# Patient Record
Sex: Female | Born: 1970 | Race: Black or African American | Hispanic: No | Marital: Married | Smoking: Never smoker
Health system: Southern US, Community
[De-identification: ages and names within clinical notes are randomized; demographics above are authoritative.]

## PROBLEM LIST (undated history)

## (undated) DIAGNOSIS — I10 Essential (primary) hypertension: Secondary | ICD-10-CM

## (undated) DIAGNOSIS — R7309 Other abnormal glucose: Secondary | ICD-10-CM

## (undated) DIAGNOSIS — E0590 Thyrotoxicosis, unspecified without thyrotoxic crisis or storm: Secondary | ICD-10-CM

## (undated) DIAGNOSIS — E042 Nontoxic multinodular goiter: Secondary | ICD-10-CM

## (undated) HISTORY — DX: Essential (primary) hypertension: I10

## (undated) HISTORY — DX: Nontoxic multinodular goiter: E04.2

## (undated) HISTORY — DX: Thyrotoxicosis, unspecified without thyrotoxic crisis or storm: E05.90

## (undated) HISTORY — DX: Other abnormal glucose: R73.09

---

## 1997-07-02 ENCOUNTER — Other Ambulatory Visit: Admission: RE | Admit: 1997-07-02 | Discharge: 1997-07-02 | Payer: Self-pay | Admitting: *Deleted

## 1997-09-03 ENCOUNTER — Other Ambulatory Visit: Admission: RE | Admit: 1997-09-03 | Discharge: 1997-09-03 | Payer: Self-pay | Admitting: *Deleted

## 1997-09-28 ENCOUNTER — Inpatient Hospital Stay (HOSPITAL_COMMUNITY): Admission: AD | Admit: 1997-09-28 | Discharge: 1997-10-01 | Payer: Self-pay | Admitting: *Deleted

## 1997-10-26 ENCOUNTER — Other Ambulatory Visit: Admission: RE | Admit: 1997-10-26 | Discharge: 1997-10-26 | Payer: Self-pay | Admitting: *Deleted

## 1998-12-02 ENCOUNTER — Other Ambulatory Visit: Admission: RE | Admit: 1998-12-02 | Discharge: 1998-12-02 | Payer: Self-pay | Admitting: *Deleted

## 1999-09-13 ENCOUNTER — Emergency Department (HOSPITAL_COMMUNITY): Admission: EM | Admit: 1999-09-13 | Discharge: 1999-09-13 | Payer: Self-pay | Admitting: *Deleted

## 1999-09-13 ENCOUNTER — Encounter: Payer: Self-pay | Admitting: Emergency Medicine

## 1999-12-12 ENCOUNTER — Other Ambulatory Visit: Admission: RE | Admit: 1999-12-12 | Discharge: 1999-12-12 | Payer: Self-pay | Admitting: *Deleted

## 2000-12-13 ENCOUNTER — Other Ambulatory Visit: Admission: RE | Admit: 2000-12-13 | Discharge: 2000-12-13 | Payer: Self-pay | Admitting: *Deleted

## 2001-02-11 ENCOUNTER — Ambulatory Visit (HOSPITAL_COMMUNITY): Admission: RE | Admit: 2001-02-11 | Discharge: 2001-02-11 | Payer: Self-pay | Admitting: Endocrinology

## 2001-02-11 ENCOUNTER — Encounter: Payer: Self-pay | Admitting: Endocrinology

## 2001-12-24 ENCOUNTER — Other Ambulatory Visit: Admission: RE | Admit: 2001-12-24 | Discharge: 2001-12-24 | Payer: Self-pay | Admitting: Obstetrics and Gynecology

## 2002-06-02 ENCOUNTER — Other Ambulatory Visit: Admission: RE | Admit: 2002-06-02 | Discharge: 2002-06-02 | Payer: Self-pay | Admitting: Obstetrics and Gynecology

## 2002-09-29 ENCOUNTER — Encounter: Payer: Self-pay | Admitting: *Deleted

## 2002-09-29 ENCOUNTER — Inpatient Hospital Stay (HOSPITAL_COMMUNITY): Admission: AD | Admit: 2002-09-29 | Discharge: 2002-09-29 | Payer: Self-pay | Admitting: Obstetrics and Gynecology

## 2002-09-30 ENCOUNTER — Inpatient Hospital Stay (HOSPITAL_COMMUNITY): Admission: AD | Admit: 2002-09-30 | Discharge: 2002-09-30 | Payer: Self-pay | Admitting: Obstetrics and Gynecology

## 2002-11-27 ENCOUNTER — Inpatient Hospital Stay (HOSPITAL_COMMUNITY): Admission: AD | Admit: 2002-11-27 | Discharge: 2002-11-27 | Payer: Self-pay | Admitting: Obstetrics and Gynecology

## 2002-12-02 ENCOUNTER — Inpatient Hospital Stay (HOSPITAL_COMMUNITY): Admission: AD | Admit: 2002-12-02 | Discharge: 2002-12-06 | Payer: Self-pay | Admitting: Obstetrics and Gynecology

## 2002-12-03 ENCOUNTER — Encounter (INDEPENDENT_AMBULATORY_CARE_PROVIDER_SITE_OTHER): Payer: Self-pay | Admitting: *Deleted

## 2002-12-28 ENCOUNTER — Other Ambulatory Visit: Admission: RE | Admit: 2002-12-28 | Discharge: 2002-12-28 | Payer: Self-pay | Admitting: Obstetrics and Gynecology

## 2003-12-27 ENCOUNTER — Ambulatory Visit: Payer: Self-pay | Admitting: Internal Medicine

## 2004-03-15 ENCOUNTER — Ambulatory Visit: Payer: Self-pay | Admitting: Endocrinology

## 2004-06-16 ENCOUNTER — Ambulatory Visit: Payer: Self-pay | Admitting: Endocrinology

## 2004-09-20 ENCOUNTER — Ambulatory Visit: Payer: Self-pay | Admitting: Endocrinology

## 2005-04-04 ENCOUNTER — Ambulatory Visit: Payer: Self-pay | Admitting: Endocrinology

## 2005-04-12 ENCOUNTER — Ambulatory Visit: Payer: Self-pay | Admitting: Internal Medicine

## 2005-09-18 ENCOUNTER — Ambulatory Visit: Payer: Self-pay | Admitting: Endocrinology

## 2006-07-31 ENCOUNTER — Ambulatory Visit: Payer: Self-pay | Admitting: Endocrinology

## 2006-07-31 LAB — CONVERTED CEMR LAB
Hgb A1c MFr Bld: 5.6 % (ref 4.6–6.0)
TSH: 0.86 microintl units/mL (ref 0.35–5.50)

## 2006-10-02 ENCOUNTER — Encounter: Payer: Self-pay | Admitting: Endocrinology

## 2006-10-02 DIAGNOSIS — I10 Essential (primary) hypertension: Secondary | ICD-10-CM

## 2006-10-02 DIAGNOSIS — E0590 Thyrotoxicosis, unspecified without thyrotoxic crisis or storm: Secondary | ICD-10-CM

## 2006-10-02 HISTORY — DX: Essential (primary) hypertension: I10

## 2006-10-02 HISTORY — DX: Thyrotoxicosis, unspecified without thyrotoxic crisis or storm: E05.90

## 2007-03-05 ENCOUNTER — Ambulatory Visit (HOSPITAL_COMMUNITY): Admission: RE | Admit: 2007-03-05 | Discharge: 2007-03-05 | Payer: Self-pay | Admitting: Obstetrics and Gynecology

## 2007-09-11 ENCOUNTER — Ambulatory Visit: Payer: Self-pay | Admitting: Endocrinology

## 2007-09-11 DIAGNOSIS — E042 Nontoxic multinodular goiter: Secondary | ICD-10-CM

## 2007-09-11 DIAGNOSIS — R7309 Other abnormal glucose: Secondary | ICD-10-CM

## 2007-09-11 HISTORY — DX: Nontoxic multinodular goiter: E04.2

## 2007-09-11 HISTORY — DX: Other abnormal glucose: R73.09

## 2007-09-11 LAB — CONVERTED CEMR LAB
CO2: 29 meq/L (ref 19–32)
Chloride: 106 meq/L (ref 96–112)
GFR calc non Af Amer: 100 mL/min
Hgb A1c MFr Bld: 5.8 % (ref 4.6–6.0)
Potassium: 3.9 meq/L (ref 3.5–5.1)
Sodium: 140 meq/L (ref 135–145)
TSH: 0.49 microintl units/mL (ref 0.35–5.50)

## 2008-03-11 ENCOUNTER — Ambulatory Visit (HOSPITAL_COMMUNITY): Admission: RE | Admit: 2008-03-11 | Discharge: 2008-03-11 | Payer: Self-pay | Admitting: Obstetrics and Gynecology

## 2008-03-18 ENCOUNTER — Encounter: Admission: RE | Admit: 2008-03-18 | Discharge: 2008-03-18 | Payer: Self-pay | Admitting: Obstetrics and Gynecology

## 2008-10-08 ENCOUNTER — Telehealth (INDEPENDENT_AMBULATORY_CARE_PROVIDER_SITE_OTHER): Payer: Self-pay | Admitting: *Deleted

## 2008-10-18 ENCOUNTER — Ambulatory Visit: Payer: Self-pay | Admitting: Endocrinology

## 2009-04-15 ENCOUNTER — Ambulatory Visit (HOSPITAL_COMMUNITY): Admission: RE | Admit: 2009-04-15 | Discharge: 2009-04-15 | Payer: Self-pay | Admitting: Obstetrics and Gynecology

## 2009-04-22 ENCOUNTER — Telehealth (INDEPENDENT_AMBULATORY_CARE_PROVIDER_SITE_OTHER): Payer: Self-pay | Admitting: *Deleted

## 2010-02-26 ENCOUNTER — Encounter: Payer: Self-pay | Admitting: Obstetrics and Gynecology

## 2010-03-05 LAB — CONVERTED CEMR LAB
Calcium: 9.3 mg/dL (ref 8.4–10.5)
GFR calc non Af Amer: 102.33 mL/min (ref >60)
Hgb A1c MFr Bld: 5.5 % (ref 4.6–6.5)
Potassium: 4 meq/L (ref 3.5–5.1)
Sodium: 140 meq/L (ref 135–145)

## 2010-03-07 NOTE — Progress Notes (Signed)
Summary: Records requuest from patient  Patient request that records be sent to Dr. Horald Pollen. Request forwarded to [facility]. Dena Chavis  April 22, 2009 10:54 AM

## 2010-04-17 ENCOUNTER — Other Ambulatory Visit: Payer: Self-pay | Admitting: Obstetrics and Gynecology

## 2010-04-27 ENCOUNTER — Other Ambulatory Visit (HOSPITAL_COMMUNITY): Payer: Self-pay | Admitting: Obstetrics and Gynecology

## 2010-04-27 DIAGNOSIS — Z1231 Encounter for screening mammogram for malignant neoplasm of breast: Secondary | ICD-10-CM

## 2010-05-05 ENCOUNTER — Ambulatory Visit (HOSPITAL_COMMUNITY): Payer: Self-pay

## 2010-05-15 ENCOUNTER — Ambulatory Visit (HOSPITAL_COMMUNITY)
Admission: RE | Admit: 2010-05-15 | Discharge: 2010-05-15 | Disposition: A | Discharge status: Home / Self Care | Payer: BC Managed Care – PPO | Source: Ambulatory Visit | Attending: Obstetrics and Gynecology | Admitting: Obstetrics and Gynecology

## 2010-05-15 DIAGNOSIS — Z1231 Encounter for screening mammogram for malignant neoplasm of breast: Secondary | ICD-10-CM | POA: Insufficient documentation

## 2010-06-23 NOTE — Op Note (Signed)
NAME:  SIMREN, POPSON                       ACCOUNT NO.:  [id number]   MEDICAL RECORD NO.:  [id number]                   PATIENT TYPE:  INP   LOCATION:  9199                                 FACILITY:  [facility]   PHYSICIAN:  Maxie Better, M.D.            DATE OF BIRTH:  [birth year]   DATE OF PROCEDURE:  12/03/2002  DATE OF DISCHARGE:                                 OPERATIVE REPORT   PREOPERATIVE DIAGNOSES:  1. Oligohydramnios.  2. Intrauterine growth restriction.  3. Pregnancy-induced hypertension.  4. Hyperthyroidism.  5. Previous cesarean section.  6. Intrauterine pregnancy at 35 weeks.   PROCEDURE:  Repeat cesarean section.   POSTOPERATIVE DIAGNOSES:  1. Intrauterine growth restriction.  2. Oligohydramnios.  3. Pregnancy-induced hypertension.  4. Hyperthyroidism.  5. Previous cesarean section.  6. Intrauterine gestation at 35 weeks.   ANESTHESIA:  Spinal.   SURGEON:  Maxie Better, M.D.   ASSISTANT:  Richardean Sale, M.D.   INDICATIONS:  This is a 40 year old, gravida 3, para 2-0-0-2, married black  female with known hyperthyroidism on PTU, who is now at [redacted] weeks gestation,  admitted on December 02, 2002, secondary to oligohydramnios and intrauterine  growth restriction noted on ultrasound in the office on December 02, 2002.  The patient was also noted to have, over the last several visits, elevation  of her blood pressure and on the visit of December 02, 2002, she was also  noted to have greater than 300 mg/dL of protein in her urine dipstick.   Based on these findings, the patient was admitted to the hospital for  further evaluation and management.  PIH laboratories were performed which  were essentially normal.  The patient had had betamethasone injection during  the course of her pregnancy and in light of her gestational age and the  associated problems with the fluid and the weight of this baby, the decision  was made to proceed with delivery.  The  patient inadvertently ate late in  the afternoon.  C-section, therefore, was scheduled for appropriate time on  December 03, 2002.  Risks and benefits of the procedure have been explained  to the patient and her husband, consent has been signed.  The patient was  transferred to the operating room.   DESCRIPTION OF PROCEDURE:  Under adequate spinal anesthesia, the patient was  placed in a supine position with a left lateral tilt.  She was sterilely  prepped and draped in the usual fashion.  An indwelling Foley catheter was  sterilely placed.  Marcaine 0.25% was injected across the previous  Pfannenstiel skin incision.   A Pfannenstiel skin incision was made through the previous scar, carried  down to the rectus fascia using Bovie cautery.  The rectus fascia was  incised in the midline and extended bilaterally.  The rectus fascia was  bluntly and sharply dissected off the rectus muscle in a superior and  inferior fashion.  The rectus muscle was split  in the midline, and the  parietal peritoneum was entered bluntly and extended superiorly and  inferiorly.  A bleeder on the undersurface of the rectus fascia superiorly  was cauterized.  The vesicouterine peritoneum was then noted.  The bladder  was adherent to the lower uterine segment.   Attempt at opening the vesicouterine peritoneum was minimally successful  and, therefore, a curvilinear low transverse uterine incision was then  carefully made just above the reflection of the bladder and extended  bilaterally using bandage scissors.  The amniotic fluid was noted.  Rupture  of membranes was performed, clear fluid.  Scant fluid was noted at the time.  Subsequent delivery of a live female was accomplished who was bulb suctioned  on the abdomen.  Cord was clamped and cut.  The baby was transferred to the  awaiting pediatrician, who assigned APGARS of 9/9 at one and five minutes.  The cord around the neck x1 was easily reduced.  The placenta  was noted to  be small, shotty, and was delivered spontaneously and sent to pathology.   The uterine cavity was cleaned of debris, and no intrauterine anomalies  noted.  The uterine incision was then closed in two layers, the first layer  with a #0 Monocryl running locked stitch, second layer was imbricated using  Lembert suturing.  Small bleeders inferiorly were electrocauterized.  Normal  tubes and ovaries were noted bilaterally.  The abdomen was then copiously  irrigated and suctioned of debris.  The bleeders on the undersurface of the  rectus fascia and the muscle were all cauterized and a fair amount of time  was spent cauterizing bleeding along the parietal peritoneum, the rectus  muscles, and the undersurface of the rectus fascia superiorly and  inferiorly.  With good hemostasis subsequently noted, the rectus fascia was  then closed with #0 Vicryl x2.  There was a small defect on the right of the  midline on the fascia, and therefore a single suture of #0 Monocryl was then  placed with subsequent closure of that defect.  The subcutaneous layer was  irrigated and suctioned of debris.  The skin was then approximated using  staples.  The specimen was placenta, sent to pathology.  Estimated blood  loss was 500 mL.  Intraoperative fluid was 3 L.  Urine output was 600 mL  clear yellow urine.  Sponge and instrument counts x2 were correct.  Complications:  None.  Weight of the baby was 4 pounds 1 ounce.   The baby was taken to the central regular nursery.  The patient was  transferred to the recovery room in stable condition.                                               Maxie Better, M.D.    [place]/MEDQ  D:  12/03/2002  T:  12/03/2002  Job:  [id number]

## 2010-06-23 NOTE — Discharge Summary (Signed)
NAME:  Danielle Day, Danielle Day                       ACCOUNT NO.:  [id number]   MEDICAL RECORD NO.:  [id number]                   PATIENT TYPE:  INP   LOCATION:  9118                                 FACILITY:  [facility]   PHYSICIAN:  Maxie Better, M.D.            DATE OF BIRTH:  1970/04/03   DATE OF ADMISSION:  12/02/2002  DATE OF DISCHARGE:  12/06/2002                                 DISCHARGE SUMMARY   ADMISSION DIAGNOSES:  1. Intrauterine growth restriction.  2. Oligohydramnios.  3. Previous cesarean section.  4. Intrauterine gestation at 34-6/7 weeks.  5. Hyperthyroidism.   DISCHARGE DIAGNOSES:  1. Intrauterine gestation at 35 weeks, delivered.  2. Intrauterine growth restriction.  3. Oligohydramnios.  4. Previous cesarean section.  5. Hyperthyroidism.   HISTORY OF PRESENT ILLNESS:  A 40 year old gravida 3, para 2-0-0-2 female  previous low transverse cesarean section admitted on December 02, 2002 at 34-  6/7 weeks secondary to oligohydramnios, intrauterine growth restriction with  an ultrasound.  The patient underwent ultrasound secondary to her  hyperthyroidism which is being managed with propylthiouracil.  Ultrasound  showed estimated fetal weight of 2094 g which is the 7th percentile.  Amniotic fluid index of 6.6 which is less than the third percentile.  The  patient denied any history of leakage of fluid, no vaginal bleeding, good  fetal movement.  Her prenatal care had been complicated by premature  contractions with a positive fetal fibronectin x1.  The patient subsequently  received betamethasone and had subsequent fetal fibronectins that were  negative.  In her third trimester the patient was noted to have gestational  hypertension with normal __________ done on October 22.  The patient is  known to have a previous intrauterine growth restricted baby.  She had no  associated symptoms of preeclampsia.  The patient does have leg swelling in  the office today.  The patient  has greater than 300 mg/dl protein on urine  dipstick.  Her blood pressure in the first trimester ranged between 102-  118/80.  Second trimester blood pressure was 114-124/50-72.  Third trimester  blood pressure was originally 114/60, however, on October 22 it was 140/100  and October 25 it was 138/88.   HOSPITAL COURSE:  The patient was admitted to [facility].  She was  placed on continuous fetal monitoring.  Group B Strep culture was obtained.  Her examination was notable for fundal height of 35 cm, transverse skin  incision.  Cervix that was closed, long.  Vertex was high.  Extremities:  There was 1+ edema.  Deep tendon reflexes were 2+.  Her tracing showed a  fetal baseline heart rate of 140-145 with accelerations to 160-165,  occasional fetal heart rate decelerations spontaneously down to 110-120s.  NICU consultation was obtained.  Continuous fetal monitoring was planned.  IV hydration was started.  Routine admission laboratories and [facility]  laboratories were performed.  A 24 urine creatinine clearance and urine  total protein was started.  A stat PIH was ordered given her proteinuria.  Her admission blood pressure was 143/93.  Her laboratory studies showed  urinalysis with 100 mg/dl protein.  Uric acid 5.8.  Platelet count was  222,000.  SGOT was 15.  LDH of 149.  Given the oligohydramnios concurrent  with intrauterine growth restriction and new onset hypertension, decision  was made to proceed with a repeat cesarean section.  Due to the time of the  patient's last meal, decision was made to place her on continuous  monitoring, perform a cesarean section on December 03, 2002.  The patient  therefore underwent a repeat cesarean section on October 28 with results of  delivery of a live female weighing 4 pounds 1 ounce, cord around the neck  x1, Apgars of 9 and 9.  Scant clear amniotic fluid had been noted at time of  procedure.  Normal tubes and ovaries were identified.  Placenta was  intact,  sent to pathology and findings were notable for mature placenta with slight  chorioamnionitis.  Normal tubes and ovaries were identified at the time of  the surgery.  Cord pH was 7.27.  The patient was continued on her PTU  medication.  She had an uncomplicated postoperative course.  CBC on  postoperative day #1 showed a hemoglobin of 11.2, hematocrit of 32, platelet  count of 209,000.  By postoperative day #3 she had tolerated a regular diet.  There were no signs and symptoms of superimposed preeclampsia.  Her blood  pressures ranged between 137-144/88-94.  Her fundus was firm, below the  umbilicus.  Incision showed no erythema, induration, or exudate.  Extremities had 1+ edema.  She was deemed well to be discharged home.   DISPOSITION:  Home.   CONDITION ON DISCHARGE:  Stable.   DISCHARGE PLAN:  Return to the office on November 4 for staple removal,  repeat blood pressure check.  Follow-up appointment otherwise at the  postpartum visit in four to six weeks.  Thyroid management per Dr. Everardo All.   DISCHARGE INSTRUCTIONS:  Call for temperature greater or equal to 100.4.  Nothing per vagina for four to six weeks.  No heavy lifting.  No driving for  two weeks.  Call for severe abdominal pain, nausea, vomiting, increased  incisional pain, redness or drainage from the incision site, soaking a  regular pad every hour or more frequently.  Warning signs for preeclampsia  were reviewed.                                               Maxie Better, M.D.    [place]/MEDQ  D:  01/07/2003  T:  01/07/2003  Job:  [id number]

## 2011-05-29 ENCOUNTER — Other Ambulatory Visit (HOSPITAL_COMMUNITY): Payer: Self-pay | Admitting: Obstetrics and Gynecology

## 2011-05-29 DIAGNOSIS — Z1231 Encounter for screening mammogram for malignant neoplasm of breast: Secondary | ICD-10-CM

## 2011-06-22 ENCOUNTER — Ambulatory Visit (HOSPITAL_COMMUNITY)
Admission: RE | Admit: 2011-06-22 | Discharge: 2011-06-22 | Disposition: A | Discharge status: Home / Self Care | Payer: BC Managed Care – PPO | Source: Ambulatory Visit | Attending: Obstetrics and Gynecology | Admitting: Obstetrics and Gynecology

## 2011-06-22 DIAGNOSIS — Z1231 Encounter for screening mammogram for malignant neoplasm of breast: Secondary | ICD-10-CM | POA: Insufficient documentation

## 2012-04-16 ENCOUNTER — Other Ambulatory Visit (HOSPITAL_COMMUNITY): Payer: Self-pay | Admitting: Obstetrics and Gynecology

## 2012-04-16 DIAGNOSIS — Z803 Family history of malignant neoplasm of breast: Secondary | ICD-10-CM

## 2012-06-23 ENCOUNTER — Ambulatory Visit (HOSPITAL_COMMUNITY)
Admission: RE | Admit: 2012-06-23 | Discharge: 2012-06-23 | Disposition: A | Discharge status: Home / Self Care | Payer: BC Managed Care – PPO | Source: Ambulatory Visit | Attending: Obstetrics and Gynecology | Admitting: Obstetrics and Gynecology

## 2012-06-23 DIAGNOSIS — Z803 Family history of malignant neoplasm of breast: Secondary | ICD-10-CM

## 2012-06-23 DIAGNOSIS — Z1231 Encounter for screening mammogram for malignant neoplasm of breast: Secondary | ICD-10-CM | POA: Insufficient documentation

## 2013-04-21 ENCOUNTER — Other Ambulatory Visit (HOSPITAL_COMMUNITY): Payer: Self-pay | Admitting: Obstetrics and Gynecology

## 2013-04-21 DIAGNOSIS — Z1231 Encounter for screening mammogram for malignant neoplasm of breast: Secondary | ICD-10-CM

## 2013-06-25 ENCOUNTER — Ambulatory Visit (HOSPITAL_COMMUNITY)
Admission: RE | Admit: 2013-06-25 | Discharge: 2013-06-25 | Disposition: A | Discharge status: Home / Self Care | Payer: BC Managed Care – PPO | Source: Ambulatory Visit | Attending: Obstetrics and Gynecology | Admitting: Obstetrics and Gynecology

## 2013-06-25 DIAGNOSIS — Z1231 Encounter for screening mammogram for malignant neoplasm of breast: Secondary | ICD-10-CM | POA: Insufficient documentation

## 2013-06-26 ENCOUNTER — Other Ambulatory Visit: Payer: Self-pay | Admitting: Obstetrics and Gynecology

## 2013-06-26 DIAGNOSIS — R928 Other abnormal and inconclusive findings on diagnostic imaging of breast: Secondary | ICD-10-CM

## 2013-07-09 ENCOUNTER — Other Ambulatory Visit: Payer: BC Managed Care – PPO

## 2013-07-09 ENCOUNTER — Ambulatory Visit
Admission: RE | Admit: 2013-07-09 | Discharge: 2013-07-09 | Disposition: A | Discharge status: Home / Self Care | Payer: BC Managed Care – PPO | Source: Ambulatory Visit | Attending: Obstetrics and Gynecology | Admitting: Obstetrics and Gynecology

## 2013-07-09 DIAGNOSIS — R928 Other abnormal and inconclusive findings on diagnostic imaging of breast: Secondary | ICD-10-CM

## 2014-06-23 ENCOUNTER — Other Ambulatory Visit: Payer: Self-pay

## 2014-06-23 DIAGNOSIS — Z1231 Encounter for screening mammogram for malignant neoplasm of breast: Secondary | ICD-10-CM

## 2014-07-09 ENCOUNTER — Ambulatory Visit: Payer: Self-pay

## 2014-07-22 ENCOUNTER — Ambulatory Visit
Admission: RE | Admit: 2014-07-22 | Discharge: 2014-07-22 | Disposition: A | Discharge status: Home / Self Care | Payer: BLUE CROSS/BLUE SHIELD | Source: Ambulatory Visit

## 2014-07-22 DIAGNOSIS — Z1231 Encounter for screening mammogram for malignant neoplasm of breast: Secondary | ICD-10-CM

## 2015-06-28 ENCOUNTER — Other Ambulatory Visit: Payer: Self-pay

## 2015-06-28 DIAGNOSIS — Z1231 Encounter for screening mammogram for malignant neoplasm of breast: Secondary | ICD-10-CM

## 2015-07-01 IMAGING — MG MM DIAGNOSTIC UNILATERAL L
3 series · 3 of 3 positions shown · non-contrast
Comparison: Mammography 06/25/2013, 06/23/2012, dating back to
03/05/2007.

CLINICAL DATA: Recall from screening mammography, possible left
breast mass.

EXAM:
DIGITAL DIAGNOSTIC LEFT MAMMOGRAM
ULTRASOUND LEFT BREAST

[L MLO (1 of 2)]
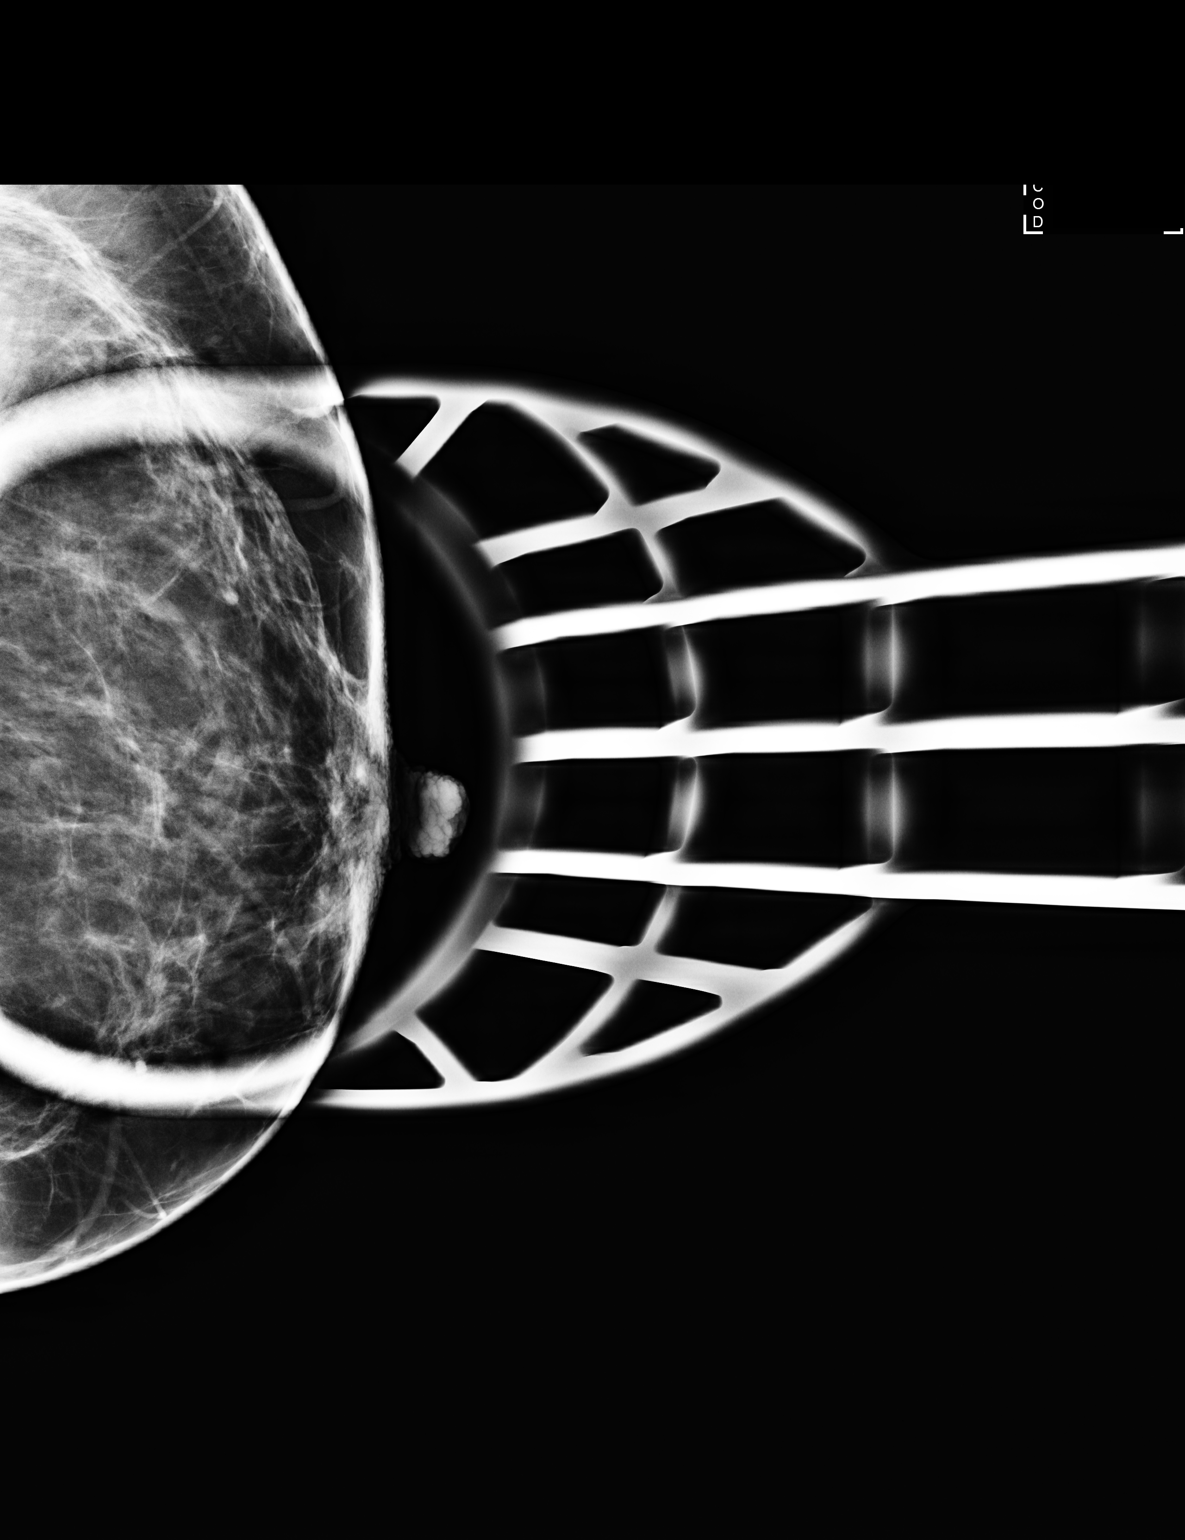

[L CC]
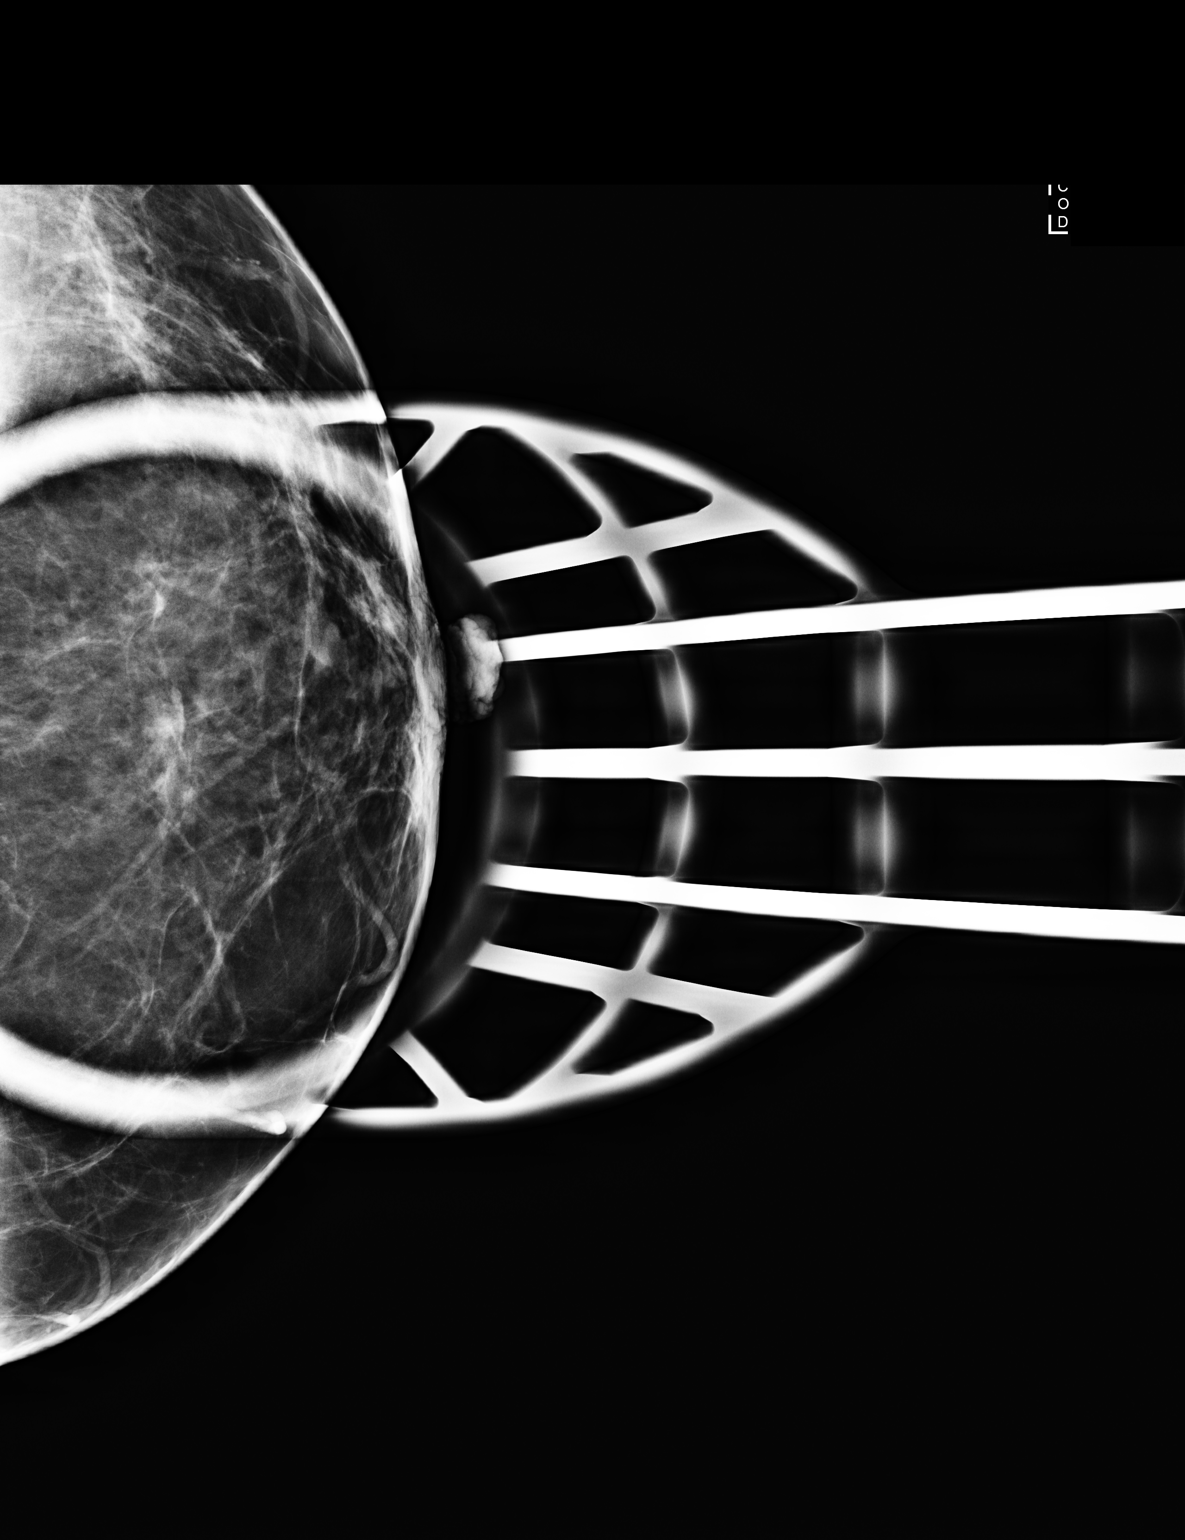

[L MLO (2 of 2)]
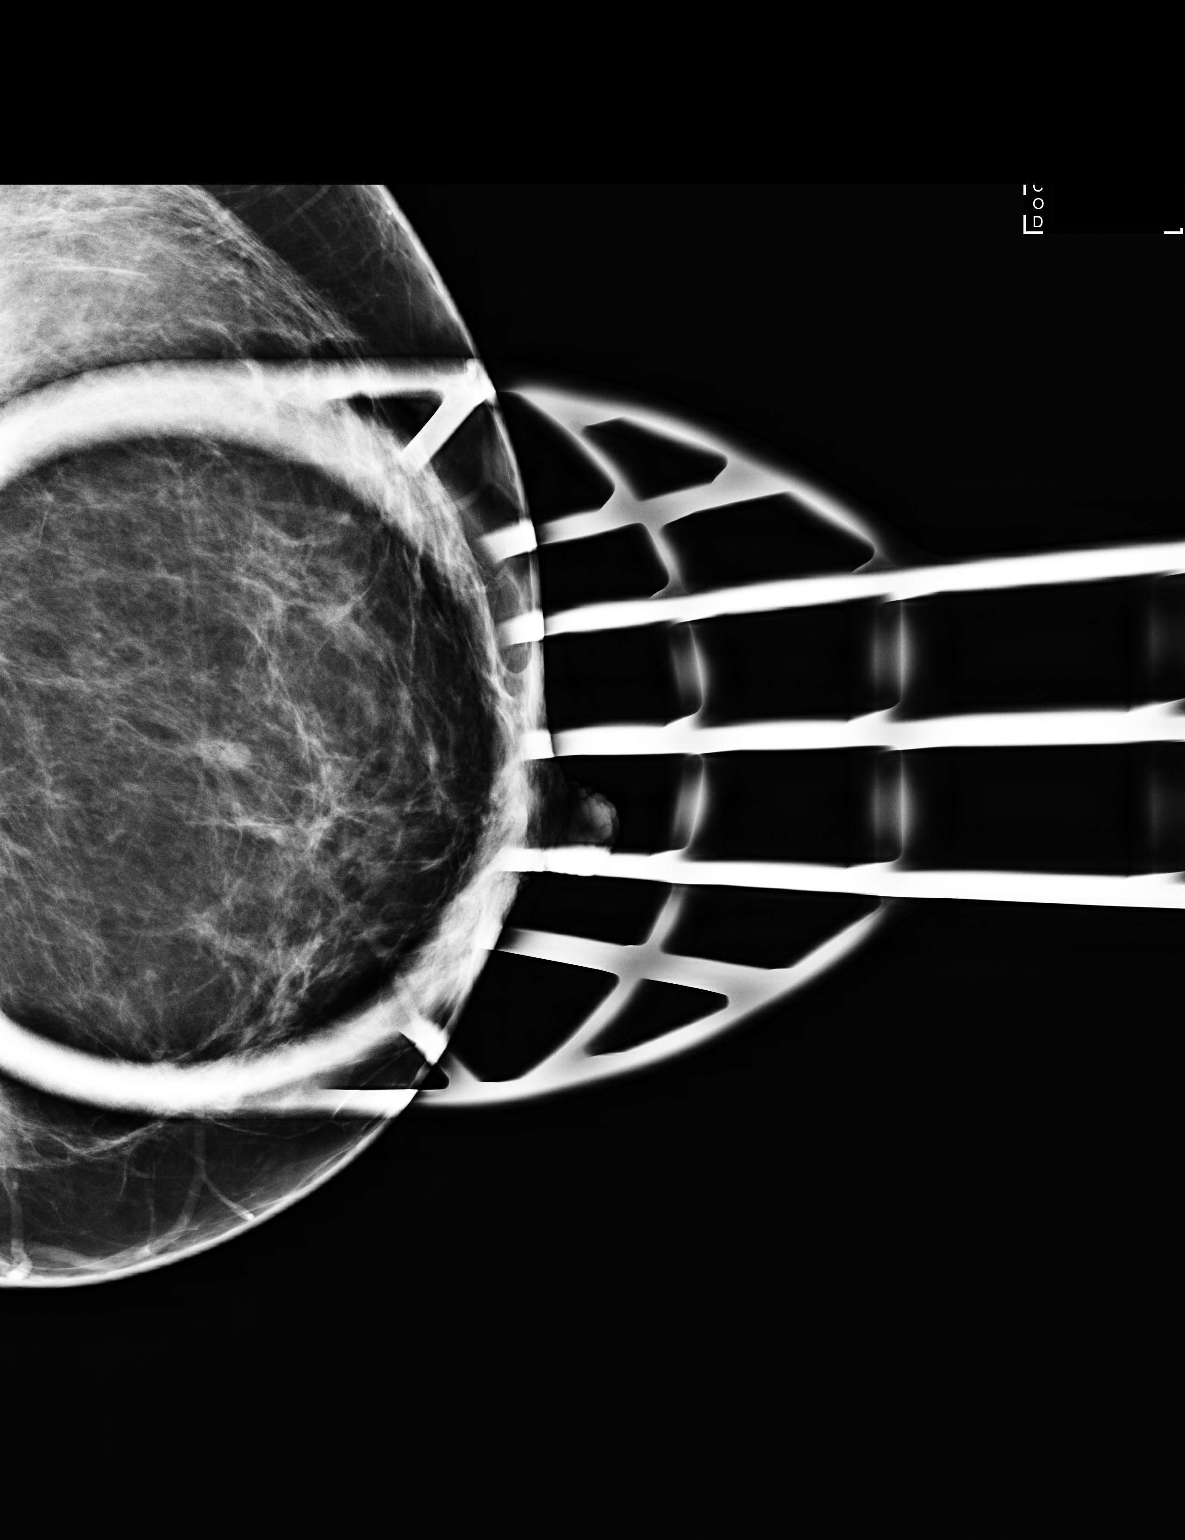

[3 of 3 positions shown; findings below may reference images not displayed]

ACR Breast Density Category b: There are scattered areas of
fibroglandular density.
FINDINGS: Spot compression CC and MLO views of the area of concern in the
subareolar left breast were obtained. No persistent mass,
architectural distortion or suspicious calcification in the area of
concern on the screening mammogram.

On physical exam, there is no palpable abnormality in the
retroareolar left breast.

Ultrasound is performed, showing normal-appearing ducts in the
subareolar left breast, accounting for the area of concern on
screening mammography. There are no intraductal masses.
IMPRESSION: No mammographic or sonographic evidence of malignancy, left breast.
Normal-appearing ducts in the subareolar left breast account for the
area of concern on screening mammography.

RECOMMENDATION:
Screening mammogram in one year.(Code:OS-1-XXU)

The importance of self breast examination and an annual clinical
breast examination was discussed with the patient. I have discussed
the findings and recommendations with the patient. Results were also
provided in writing at the conclusion of the visit. If applicable, a
reminder letter will be sent to the patient regarding the next
appointment.

BI-RADS CATEGORY  1: Negative.

## 2015-07-25 ENCOUNTER — Ambulatory Visit
Admission: RE | Admit: 2015-07-25 | Discharge: 2015-07-25 | Disposition: A | Discharge status: Home / Self Care | Payer: BC Managed Care – PPO | Source: Ambulatory Visit

## 2015-07-25 DIAGNOSIS — Z1231 Encounter for screening mammogram for malignant neoplasm of breast: Secondary | ICD-10-CM

## 2015-08-08 ENCOUNTER — Telehealth: Payer: Self-pay | Admitting: Emergency Medicine

## 2015-08-08 NOTE — Telephone Encounter (Signed)
No medications on patient list, left patient message to give us a call back

## 2015-08-08 NOTE — Telephone Encounter (Signed)
Patient called and needs refill on blood pressure med Amilorice . Has an appointment scheduled for 08/23/2015 at 9:30 am.

## 2015-08-10 NOTE — Telephone Encounter (Signed)
Pt request to speak to the assistant concern about this med. Pt stated that Dr. Everardo AllEllison gave this med to her, she will be out of this med on 08/21/15 and she has an appt with Dr. Jonny RuizJohn on 08/23/15 ( she was wondering if Dr. Jonny RuizJohn can give her some until she comes in for the appt). Please call her back

## 2015-08-10 NOTE — Telephone Encounter (Signed)
Unfortunately pt has not been seen within the [place] system/[facility] since 2010 (I think by my looking at the record)  Pt is therefore a non established pt, since has been more than 3 yrs  Please make OV to become established, as refills cannot be made when this has not occurred

## 2015-08-10 NOTE — Telephone Encounter (Signed)
Please advise can we do this 

## 2015-08-23 ENCOUNTER — Encounter: Payer: Self-pay | Admitting: Internal Medicine

## 2015-08-23 ENCOUNTER — Ambulatory Visit (INDEPENDENT_AMBULATORY_CARE_PROVIDER_SITE_OTHER): Payer: BC Managed Care – PPO | Admitting: Internal Medicine

## 2015-08-23 ENCOUNTER — Other Ambulatory Visit (INDEPENDENT_AMBULATORY_CARE_PROVIDER_SITE_OTHER): Payer: BC Managed Care – PPO

## 2015-08-23 VITALS — BP 130/70 | HR 99 | Temp 98.0°F | Resp 20 | Wt 218.0 lb

## 2015-08-23 DIAGNOSIS — R7309 Other abnormal glucose: Secondary | ICD-10-CM | POA: Diagnosis not present

## 2015-08-23 DIAGNOSIS — Z0000 Encounter for general adult medical examination without abnormal findings: Secondary | ICD-10-CM | POA: Insufficient documentation

## 2015-08-23 DIAGNOSIS — Z0001 Encounter for general adult medical examination with abnormal findings: Secondary | ICD-10-CM | POA: Diagnosis not present

## 2015-08-23 DIAGNOSIS — R6889 Other general symptoms and signs: Secondary | ICD-10-CM

## 2015-08-23 DIAGNOSIS — I10 Essential (primary) hypertension: Secondary | ICD-10-CM | POA: Diagnosis not present

## 2015-08-23 DIAGNOSIS — E0590 Thyrotoxicosis, unspecified without thyrotoxic crisis or storm: Secondary | ICD-10-CM

## 2015-08-23 LAB — URINALYSIS, ROUTINE W REFLEX MICROSCOPIC
BILIRUBIN URINE: NEGATIVE
HGB URINE DIPSTICK: NEGATIVE
Ketones, ur: NEGATIVE
LEUKOCYTES UA: NEGATIVE
NITRITE: NEGATIVE
RBC / HPF: NONE SEEN (ref 0–?)
Specific Gravity, Urine: 1.01 (ref 1.000–1.030)
TOTAL PROTEIN, URINE-UPE24: NEGATIVE
URINE GLUCOSE: NEGATIVE
Urobilinogen, UA: 0.2 (ref 0.0–1.0)
WBC UA: NONE SEEN (ref 0–?)
pH: 6.5 (ref 5.0–8.0)

## 2015-08-23 LAB — CBC WITH DIFFERENTIAL/PLATELET
BASOS PCT: 0.5 % (ref 0.0–3.0)
Basophils Absolute: 0 10*3/uL (ref 0.0–0.1)
EOS PCT: 4.8 % (ref 0.0–5.0)
Eosinophils Absolute: 0.2 10*3/uL (ref 0.0–0.7)
HEMATOCRIT: 41.8 % (ref 36.0–46.0)
HEMOGLOBIN: 14.5 g/dL (ref 12.0–15.0)
LYMPHS PCT: 28.3 % (ref 12.0–46.0)
Lymphs Abs: 1.4 10*3/uL (ref 0.7–4.0)
MCHC: 34.7 g/dL (ref 30.0–36.0)
MCV: 90.8 fl (ref 78.0–100.0)
MONO ABS: 0.3 10*3/uL (ref 0.1–1.0)
MONOS PCT: 6.8 % (ref 3.0–12.0)
Neutro Abs: 2.9 10*3/uL (ref 1.4–7.7)
Neutrophils Relative %: 59.6 % (ref 43.0–77.0)
Platelets: 195 10*3/uL (ref 150.0–400.0)
RBC: 4.61 Mil/uL (ref 3.87–5.11)
RDW: 13.6 % (ref 11.5–15.5)
WBC: 4.8 10*3/uL (ref 4.0–10.5)

## 2015-08-23 LAB — TSH: TSH: 1.02 u[IU]/mL (ref 0.35–4.50)

## 2015-08-23 LAB — BASIC METABOLIC PANEL
BUN: 11 mg/dL (ref 6–23)
CALCIUM: 10 mg/dL (ref 8.4–10.5)
CO2: 29 mEq/L (ref 19–32)
CREATININE: 0.68 mg/dL (ref 0.40–1.20)
Chloride: 104 mEq/L (ref 96–112)
GFR: 119.52 mL/min (ref >60.00)
GLUCOSE: 97 mg/dL (ref 70–99)
Potassium: 4.2 mEq/L (ref 3.5–5.1)
Sodium: 139 mEq/L (ref 135–145)

## 2015-08-23 LAB — HEMOGLOBIN A1C: HEMOGLOBIN A1C: 6 % (ref 4.6–6.5)

## 2015-08-23 LAB — HEPATIC FUNCTION PANEL
ALBUMIN: 4.3 g/dL (ref 3.5–5.2)
ALT: 15 U/L (ref 0–35)
AST: 19 U/L (ref 0–37)
Alkaline Phosphatase: 59 U/L (ref 39–117)
BILIRUBIN TOTAL: 0.5 mg/dL (ref 0.2–1.2)
Bilirubin, Direct: 0.1 mg/dL (ref 0.0–0.3)
Total Protein: 8.1 g/dL (ref 6.0–8.3)

## 2015-08-23 LAB — LIPID PANEL
Cholesterol: 216 mg/dL — ABNORMAL HIGH (ref 0–200)
HDL: 60 mg/dL (ref >39.00)
LDL Cholesterol: 143 mg/dL — ABNORMAL HIGH (ref 0–99)
NONHDL: 156.35
TRIGLYCERIDES: 67 mg/dL (ref 0.0–149.0)
Total CHOL/HDL Ratio: 4
VLDL: 13.4 mg/dL (ref 0.0–40.0)

## 2015-08-23 MED ORDER — AMILORIDE HCL 5 MG PO TABS: 5.0000 mg | ORAL_TABLET | Freq: Every day | ORAL | Status: DC

## 2015-08-23 NOTE — Patient Instructions (Signed)

## 2015-08-23 NOTE — Assessment & Plan Note (Signed)
S/p iodine and off all meds

## 2015-08-23 NOTE — Progress Notes (Signed)
Subjective:    Patient ID: Danielle Day, female    DOB: 05/27/1970, 45 y.o.   MRN: [id number]  HPI  Here for wellness and f/u;  Overall doing ok;  Pt denies Chest pain, worsening SOB, DOE, wheezing, orthopnea, PND, worsening LE edema, palpitations, dizziness or syncope.  Pt denies neurological change such as new headache, facial or extremity weakness.  Pt denies polydipsia, polyuria, or low sugar symptoms. Pt states overall good compliance with treatment and medications, good tolerability, and has been trying to follow appropriate diet.  Pt denies worsening depressive symptoms, suicidal ideation or panic. No fever, night sweats, wt loss, loss of appetite, or other constitutional symptoms.  Pt states good ability with ADL's, has low fall risk, home safety reviewed and adequate, no other significant changes in hearing or vision, and only occasionally active with exercise. Sees Dr Cousins/GYN with aug 2016 with mild hyperglycemia/high normal a1c Past Medical History:  Diagnosis Date  . Essential hypertension 10/02/2006   Qualifier: Diagnosis of  By: Everardo AllEllison MD, Cleophas DunkerSean A   . GOITER, MULTINODULAR 09/11/2007   Qualifier: Diagnosis of  By: Everardo AllEllison MD, Cleophas DunkerSean A   . HYPERGLYCEMIA 09/11/2007   Qualifier: Diagnosis of  By: Everardo AllEllison MD, Cleophas DunkerSean A   . Hyperthyroidism 10/02/2006   Qualifier: Diagnosis of  By: Everardo AllEllison MD, Sean A    No past surgical history on file.  reports that she has never smoked. She does not have any smokeless tobacco history on file. Her alcohol and drug histories are not on file. family history is not on file. Not on File No current outpatient prescriptions on file prior to visit.   No current facility-administered medications on file prior to visit.   '  Review of Systems Constitutional: Negative for increased diaphoresis, or other activity, appetite or siginficant weight change other than noted HENT: Negative for worsening hearing loss, ear pain, facial swelling, mouth sores and neck  stiffness.   Eyes: Negative for other worsening pain, redness or visual disturbance.  Respiratory: Negative for choking or stridor Cardiovascular: Negative for other chest pain and palpitations.  Gastrointestinal: Negative for worsening diarrhea, blood in stool, or abdominal distention Genitourinary: Negative for hematuria, flank pain or change in urine volume.  Musculoskeletal: Negative for myalgias or other joint complaints.  Skin: Negative for other color change and wound or drainage.  Neurological: Negative for syncope and numbness. other than noted Hematological: Negative for adenopathy. or other swelling Psychiatric/Behavioral: Negative for hallucinations, SI, self-injury, decreased concentration or other worsening agitation.      Objective:   Physical Exam BP 130/70   Pulse 99   Temp 98 F (36.7 C) (Oral)   Resp 20   Wt 218 lb (98.9 kg)   SpO2 98%   BMI 37.42 kg/m   VS noted,  Constitutional: Pt is oriented to person, place, and time. Appears well-developed and well-nourished, in no significant distress Head: Normocephalic and atraumatic  Eyes: Conjunctivae and EOM are normal. Pupils are equal, round, and reactive to light Right Ear: External ear normal.  Left Ear: External ear normal Nose: Nose normal.  Mouth/Throat: Oropharynx is clear and moist  Neck: Normal range of motion. Neck supple. No JVD present. No tracheal deviation present or significant neck LA or mass Cardiovascular: Normal rate, regular rhythm, normal heart sounds and intact distal pulses.   Pulmonary/Chest: Effort normal and breath sounds without rales or wheezing  Abdominal: Soft. Bowel sounds are normal. NT. No HSM  Musculoskeletal: Normal range of motion. Exhibits no edema  Lymphadenopathy: Has no cervical adenopathy.  Neurological: Pt is alert and oriented to person, place, and time. Pt has normal reflexes. No cranial nerve deficit. Motor grossly intact Skin: Skin is warm and dry. No rash noted or new  ulcers Psychiatric:  Has normal mood and affect. Behavior is normal.     Assessment & Plan:

## 2015-08-23 NOTE — Progress Notes (Signed)
Pre visit review using our clinic review tool, if applicable. No additional management support is needed unless otherwise documented below in the visit note. 

## 2015-08-26 ENCOUNTER — Telehealth: Payer: Self-pay | Admitting: *Deleted

## 2015-08-26 MED ORDER — AMILORIDE-HYDROCHLOROTHIAZIDE 5-50 MG PO TABS: ORAL_TABLET | ORAL | Status: DC

## 2015-08-26 NOTE — Telephone Encounter (Signed)
Pharmacy left msg on triage stating calling to clarify which medication pt suppose to be taking. We received script for Amiloride, but pt states she suppose to be taking the combination w/HCTZ. Pls advise on which pt should be taking...Raechel Chute/lmb

## 2015-08-26 NOTE — Telephone Encounter (Signed)
Ok, this has been corrected, thanks

## 2015-08-28 ENCOUNTER — Encounter: Payer: Self-pay | Admitting: Internal Medicine

## 2015-08-28 NOTE — Assessment & Plan Note (Signed)

## 2015-08-28 NOTE — Assessment & Plan Note (Signed)
Mild, for a1c,  to f/u any worsening symptoms or concerns 

## 2015-08-28 NOTE — Assessment & Plan Note (Signed)
For med restart,  to f/u any worsening symptoms or concerns  

## 2015-12-06 ENCOUNTER — Encounter: Payer: Self-pay | Admitting: Nurse Practitioner

## 2015-12-06 ENCOUNTER — Ambulatory Visit (INDEPENDENT_AMBULATORY_CARE_PROVIDER_SITE_OTHER): Payer: BC Managed Care – PPO | Admitting: Nurse Practitioner

## 2015-12-06 ENCOUNTER — Ambulatory Visit: Payer: BC Managed Care – PPO | Admitting: Nurse Practitioner

## 2015-12-06 VITALS — BP 116/68 | HR 115 | Temp 98.7°F | Wt 213.0 lb

## 2015-12-06 DIAGNOSIS — J0140 Acute pansinusitis, unspecified: Secondary | ICD-10-CM | POA: Diagnosis not present

## 2015-12-06 MED ORDER — FLUTICASONE PROPIONATE 50 MCG/ACT NA SUSP: 2.0000 | Freq: Every day | NASAL | 0 refills | Status: DC

## 2015-12-06 MED ORDER — GUAIFENESIN ER 600 MG PO TB12: 600.0000 mg | ORAL_TABLET | Freq: Two times a day (BID) | ORAL | 0 refills | Status: DC | PRN

## 2015-12-06 MED ORDER — AMOXICILLIN-POT CLAVULANATE 875-125 MG PO TABS: 1.0000 | ORAL_TABLET | Freq: Two times a day (BID) | ORAL | 0 refills | Status: DC

## 2015-12-06 MED ORDER — OXYMETAZOLINE HCL 0.05 % NA SOLN: 1.0000 | Freq: Two times a day (BID) | NASAL | 0 refills | Status: DC

## 2015-12-06 NOTE — Progress Notes (Signed)
Subjective:  Patient ID: Danielle CatalanShannon M Ecklund, female    DOB: 09/01/1970  Age: 45 y.o. MRN: [id number]  CC: Sinus Problem (Pt stated having sinus pressure, ear pain, coughing for 2 weeks)   Sinus Problem  This is a new problem. The current episode started 1 to 4 weeks ago. The problem has been gradually worsening since onset. There has been no fever. Associated symptoms include congestion, coughing, ear pain, headaches, sinus pressure, sneezing and a sore throat. Pertinent negatives include no chills, diaphoresis, hoarse voice, shortness of breath or swollen glands. Past treatments include acetaminophen and oral decongestants. The treatment provided no relief.    Outpatient Medications Prior to Visit  Medication Sig Dispense Refill  . amiloride-hydrochlorothiazide (MODURETIC) 5-50 MG tablet 1/2 tab by mouth daily 90 tablet 3   No facility-administered medications prior to visit.     ROS See HPI  Objective:  BP 116/68 (BP Location: Left Arm, Patient Position: Sitting, Cuff Size: Large)   Pulse (!) 115   Temp 98.7 F (37.1 C)   Wt 213 lb (96.6 kg)   SpO2 98%   BMI 36.56 kg/m   BP Readings from Last 3 Encounters:  12/06/15 116/68  08/23/15 130/70  10/18/08 116/64    Wt Readings from Last 3 Encounters:  12/06/15 213 lb (96.6 kg)  08/23/15 218 lb (98.9 kg)  10/18/08 189 lb (85.7 kg)    Physical Exam  HENT:  Right Ear: Tympanic membrane, external ear and ear canal normal.  Left Ear: Tympanic membrane, external ear and ear canal normal.  Nose: Mucosal edema and rhinorrhea present.  Mouth/Throat: Uvula is midline. Posterior oropharyngeal erythema present. No oropharyngeal exudate.  Eyes: No scleral icterus.  Neck: Normal range of motion. Neck supple.  Lymphadenopathy:    She has no cervical adenopathy.  Vitals reviewed.   Lab Results  Component Value Date   WBC 4.8 08/23/2015   HGB 14.5 08/23/2015   HCT 41.8 08/23/2015   PLT 195.0 08/23/2015   GLUCOSE 97 08/23/2015     CHOL 216 (H) 08/23/2015   TRIG 67.0 08/23/2015   HDL 60.00 08/23/2015   LDLCALC 143 (H) 08/23/2015   ALT 15 08/23/2015   AST 19 08/23/2015   NA 139 08/23/2015   K 4.2 08/23/2015   CL 104 08/23/2015   CREATININE 0.68 08/23/2015   BUN 11 08/23/2015   CO2 29 08/23/2015   TSH 1.02 08/23/2015   HGBA1C 6.0 08/23/2015    Mm Digital Screening Bilateral  Result Date: 07/27/2015 CLINICAL DATA:  Screening. EXAM: DIGITAL SCREENING BILATERAL MAMMOGRAM WITH CAD COMPARISON:  Previous exam(s). ACR Breast Density Category b: There are scattered areas of fibroglandular density. FINDINGS: There are no findings suspicious for malignancy. Images were processed with CAD. IMPRESSION: No mammographic evidence of malignancy. A result letter of this screening mammogram will be mailed directly to the patient. RECOMMENDATION: Screening mammogram in one year. (Code:SM-B-01Y) BI-RADS CATEGORY  1: Negative. Electronically Signed   By: Baird Lyonsina  Arceo M.D.   On: 07/27/2015 13:56    Assessment & Plan:   Carollee HerterShannon was seen today for sinus problem.  Diagnoses and all orders for this visit:  Acute non-recurrent pansinusitis -     amoxicillin-clavulanate (AUGMENTIN) 875-125 MG tablet; Take 1 tablet by mouth 2 (two) times daily. -     guaiFENesin (MUCINEX) 600 MG 12 hr tablet; Take 1 tablet (600 mg total) by mouth 2 (two) times daily as needed for cough or to loosen phlegm. -     fluticasone (  FLONASE) 50 MCG/ACT nasal spray; Place 2 sprays into both nostrils daily. -     oxymetazoline (AFRIN NASAL SPRAY) 0.05 % nasal spray; Place 1 spray into both nostrils 2 (two) times daily. Use only for 3days, then stop   I am having Ms. Parillo start on amoxicillin-clavulanate, guaiFENesin, fluticasone, and oxymetazoline. I am also having her maintain her amiloride-hydrochlorothiazide, loratadine, and multivitamin.  Meds ordered this encounter  Medications  . loratadine (CLARITIN) 10 MG tablet    Sig: Take 10 mg by mouth daily.   . Multiple Vitamin (MULTIVITAMIN) tablet    Sig: Take 1 tablet by mouth daily.  Marland Kitchen. amoxicillin-clavulanate (AUGMENTIN) 875-125 MG tablet    Sig: Take 1 tablet by mouth 2 (two) times daily.    Dispense:  14 tablet    Refill:  0    Order Specific Question:   Supervising Provider    Answer:   Tresa GarterPLOTNIKOV, ALEKSEI V [[id number]]  . guaiFENesin (MUCINEX) 600 MG 12 hr tablet    Sig: Take 1 tablet (600 mg total) by mouth 2 (two) times daily as needed for cough or to loosen phlegm.    Dispense:  14 tablet    Refill:  0    Order Specific Question:   Supervising Provider    Answer:   Tresa GarterPLOTNIKOV, ALEKSEI V [[id number]]  . fluticasone (FLONASE) 50 MCG/ACT nasal spray    Sig: Place 2 sprays into both nostrils daily.    Dispense:  16 g    Refill:  0    Order Specific Question:   Supervising Provider    Answer:   Tresa GarterPLOTNIKOV, ALEKSEI V [[id number]]  . oxymetazoline (AFRIN NASAL SPRAY) 0.05 % nasal spray    Sig: Place 1 spray into both nostrils 2 (two) times daily. Use only for 3days, then stop    Dispense:  30 mL    Refill:  0    Order Specific Question:   Supervising Provider    Answer:   Tresa GarterPLOTNIKOV, ALEKSEI V [[id number]]    Follow-up: Return if symptoms worsen or fail to improve.  Alysia Pennaharlotte Tahsin Benyo, NP

## 2015-12-06 NOTE — Patient Instructions (Signed)
URI Instructions: Flonase and Afrin use: apply 1spray of afrin in each nare, wait 5mins, then apply 2sprays of flonase in each nare. Use both nasal spray consecutively x 3days, then flonase only for at least 14days.  Use over-the-counter  "cold" medicines  such as "Tylenol cold" , "Advil cold",  "Mucinex" or" Mucinex D"  for cough and congestion.  Avoid decongestants if you have high blood pressure. Use" Delsym" or" Robitussin" cough syrup varietis for cough.  You can use plain "Tylenol" or "Advi"l for fever, chills and achyness.   "Common cold" symptoms are usually triggered by a virus.  The antibiotics are usually not necessary. On average, a" viral cold" illness would take 4-7 days to resolve. Please, make an appointment if you are not better or if you're worse.  

## 2015-12-06 NOTE — Progress Notes (Signed)
Pre visit review using our clinic review tool, if applicable. No additional management support is needed unless otherwise documented below in the visit note. 

## 2016-01-05 ENCOUNTER — Telehealth: Payer: Self-pay | Admitting: Nurse Practitioner

## 2016-01-05 DIAGNOSIS — J0140 Acute pansinusitis, unspecified: Secondary | ICD-10-CM

## 2016-01-05 NOTE — Telephone Encounter (Signed)
Pt request refill for Fluticason 50 mcg nas spray from [facility]. Pt was seen by you on 12/06/2015. We sent 1 rx for this med in October 2017. Please advise

## 2016-01-05 NOTE — Telephone Encounter (Signed)
Ok to send

## 2016-01-06 MED ORDER — FLUTICASONE PROPIONATE 50 MCG/ACT NA SUSP: 2.0000 | Freq: Every day | NASAL | 0 refills | Status: DC

## 2016-01-06 NOTE — Telephone Encounter (Signed)
Rx for fluticasone sent to [facility].

## 2016-02-17 ENCOUNTER — Telehealth: Payer: Self-pay

## 2016-02-17 DIAGNOSIS — J0140 Acute pansinusitis, unspecified: Secondary | ICD-10-CM

## 2016-02-17 MED ORDER — FLUTICASONE PROPIONATE 50 MCG/ACT NA SUSP: 2.0000 | Freq: Every day | NASAL | 0 refills | Status: DC

## 2016-02-17 NOTE — Telephone Encounter (Signed)
Pt called and is requesting a refill of flonase. Please advise.

## 2016-02-17 NOTE — Telephone Encounter (Signed)
LVM for pt to call back as soon as possible.   RE: [facility] message for pt [facility]

## 2016-02-17 NOTE — Telephone Encounter (Signed)
Ok for routine refill, thanks 

## 2016-03-16 ENCOUNTER — Other Ambulatory Visit: Payer: Self-pay | Admitting: Internal Medicine

## 2016-03-16 DIAGNOSIS — J0140 Acute pansinusitis, unspecified: Secondary | ICD-10-CM

## 2016-05-03 ENCOUNTER — Other Ambulatory Visit: Payer: Self-pay | Admitting: Internal Medicine

## 2016-05-03 DIAGNOSIS — J0140 Acute pansinusitis, unspecified: Secondary | ICD-10-CM

## 2016-05-19 ENCOUNTER — Encounter (INDEPENDENT_AMBULATORY_CARE_PROVIDER_SITE_OTHER): Payer: Self-pay

## 2016-05-19 ENCOUNTER — Ambulatory Visit (INDEPENDENT_AMBULATORY_CARE_PROVIDER_SITE_OTHER): Payer: BC Managed Care – PPO | Admitting: Family Medicine

## 2016-05-19 ENCOUNTER — Encounter: Payer: Self-pay | Admitting: Family Medicine

## 2016-05-19 VITALS — BP 104/66 | HR 102 | Temp 97.9°F | Wt 213.1 lb

## 2016-05-19 DIAGNOSIS — J0100 Acute maxillary sinusitis, unspecified: Secondary | ICD-10-CM | POA: Diagnosis not present

## 2016-05-19 MED ORDER — AMOXICILLIN 875 MG PO TABS: 875.0000 mg | ORAL_TABLET | Freq: Two times a day (BID) | ORAL | 0 refills | Status: DC

## 2016-05-19 NOTE — Progress Notes (Signed)
Pre visit review using our clinic review tool, if applicable. No additional management support is needed unless otherwise documented below in the visit note. 

## 2016-05-19 NOTE — Patient Instructions (Signed)
Follow up as needed Start the Amoxicillin twice daily- take w/ food Drink plenty of fluids Continue the Zyrtec daily REST! Call with any questions or concerns Hang in there!!! 

## 2016-05-19 NOTE — Progress Notes (Signed)
   Subjective:    Patient ID: Danielle Day, female    DOB: 05-18-1970, 46 y.o.   MRN: [id number]  HPI URI- pt reports she struggles w/ seasonal allergies and has frequent sinus infxns.  Yesterday developed severe head pressure, dizziness, ear popping w/ swallowing, HA.  Upper tooth pain on R.  + nausea.   Denies cough.    Review of Systems For ROS see HPI     Objective:   Physical Exam  Constitutional: She is oriented to person, place, and time. She appears well-developed and well-nourished. No distress.  HENT:  Head: Normocephalic and atraumatic.  Right Ear: Tympanic membrane normal.  Left Ear: Tympanic membrane normal.  Nose: Mucosal edema and rhinorrhea present. Right sinus exhibits maxillary sinus tenderness and frontal sinus tenderness. Left sinus exhibits maxillary sinus tenderness and frontal sinus tenderness.  Mouth/Throat: Uvula is midline and mucous membranes are normal. Posterior oropharyngeal erythema present. No oropharyngeal exudate.  Eyes: Conjunctivae and EOM are normal. Pupils are equal, round, and reactive to light.  Neck: Normal range of motion. Neck supple.  Cardiovascular: Normal rate, regular rhythm and normal heart sounds.   Pulmonary/Chest: Effort normal and breath sounds normal. No respiratory distress. She has no wheezes.  Lymphadenopathy:    She has no cervical adenopathy.  Neurological: She is alert and oriented to person, place, and time.  Skin: Skin is warm and dry.  Psychiatric: She has a normal mood and affect. Her behavior is normal. Thought content normal.  Vitals reviewed.         Assessment & Plan:  Maxillary sinusitis- new.  Pt's sxs and PE consistent w/ infxn.  Start abx.  Reviewed supportive care and red flags that should prompt return.  Pt expressed understanding and is in agreement w/ plan.

## 2016-06-04 ENCOUNTER — Other Ambulatory Visit: Payer: Self-pay | Admitting: Nurse Practitioner

## 2016-06-04 DIAGNOSIS — J0140 Acute pansinusitis, unspecified: Secondary | ICD-10-CM

## 2016-07-26 ENCOUNTER — Other Ambulatory Visit: Payer: Self-pay | Admitting: Obstetrics and Gynecology

## 2016-07-26 DIAGNOSIS — Z1231 Encounter for screening mammogram for malignant neoplasm of breast: Secondary | ICD-10-CM

## 2016-08-09 ENCOUNTER — Ambulatory Visit
Admission: RE | Admit: 2016-08-09 | Discharge: 2016-08-09 | Disposition: A | Discharge status: Home / Self Care | Payer: BC Managed Care – PPO | Source: Ambulatory Visit | Attending: Obstetrics and Gynecology | Admitting: Obstetrics and Gynecology

## 2016-08-09 ENCOUNTER — Encounter: Payer: Self-pay | Admitting: Radiology

## 2016-08-09 DIAGNOSIS — Z1231 Encounter for screening mammogram for malignant neoplasm of breast: Secondary | ICD-10-CM

## 2016-11-02 ENCOUNTER — Other Ambulatory Visit: Payer: Self-pay | Admitting: Internal Medicine

## 2016-11-10 ENCOUNTER — Other Ambulatory Visit: Payer: Self-pay | Admitting: Internal Medicine

## 2016-11-15 ENCOUNTER — Ambulatory Visit (INDEPENDENT_AMBULATORY_CARE_PROVIDER_SITE_OTHER): Payer: BC Managed Care – PPO | Admitting: Internal Medicine

## 2016-11-15 ENCOUNTER — Encounter: Payer: Self-pay | Admitting: Internal Medicine

## 2016-11-15 VITALS — BP 120/80 | HR 117 | Temp 98.2°F | Ht 64.0 in | Wt 215.0 lb

## 2016-11-15 DIAGNOSIS — Z23 Encounter for immunization: Secondary | ICD-10-CM | POA: Diagnosis not present

## 2016-11-15 DIAGNOSIS — J309 Allergic rhinitis, unspecified: Secondary | ICD-10-CM

## 2016-11-15 DIAGNOSIS — Z0000 Encounter for general adult medical examination without abnormal findings: Secondary | ICD-10-CM | POA: Diagnosis not present

## 2016-11-15 DIAGNOSIS — J0140 Acute pansinusitis, unspecified: Secondary | ICD-10-CM | POA: Diagnosis not present

## 2016-11-15 MED ORDER — AMILORIDE-HYDROCHLOROTHIAZIDE 5-50 MG PO TABS: ORAL_TABLET | ORAL | 3 refills | Status: DC

## 2016-11-15 MED ORDER — FLUTICASONE PROPIONATE 50 MCG/ACT NA SUSP: NASAL | 5 refills | Status: DC

## 2016-11-15 MED ORDER — PREDNISONE 10 MG PO TABS: ORAL_TABLET | ORAL | 0 refills | Status: DC

## 2016-11-15 NOTE — Assessment & Plan Note (Signed)

## 2016-11-15 NOTE — Progress Notes (Signed)
Subjective:    Patient ID: Danielle Day, female    DOB: 1970-06-17, 46 y.o.   MRN: [id number]  HPI  Here for wellness and f/u;  Overall doing ok;  Pt denies Chest pain, worsening SOB, DOE, wheezing, orthopnea, PND, worsening LE edema, palpitations, dizziness or syncope.  Pt denies neurological change such as new headache, facial or extremity weakness.  Pt denies polydipsia, polyuria, or low sugar symptoms. Pt states overall good compliance with treatment and medications, good tolerability, and has been trying to follow appropriate diet.  Pt denies worsening depressive symptoms, suicidal ideation or panic. No fever, night sweats, wt loss, loss of appetite, or other constitutional symptoms.  Pt states good ability with ADL's, has low fall risk, home safety reviewed and adequate, no other significant changes in hearing or vision, and only occasionally active with exercise. Plans to try do more soon. Had labs with GYN last month, also referred for sleep apnea evaluation. .Does have several wks ongoing nasal allergy symptoms with clearish congestion, itch and sneezing, without fever, pain, ST, cough, swelling or wheezing. Past Medical History:  Diagnosis Date  . Essential hypertension 10/02/2006   Qualifier: Diagnosis of  By: Everardo All MD, Cleophas Dunker   . GOITER, MULTINODULAR 09/11/2007   Qualifier: Diagnosis of  By: Everardo All MD, Cleophas Dunker   . HYPERGLYCEMIA 09/11/2007   Qualifier: Diagnosis of  By: Everardo All MD, Cleophas Dunker   . Hyperthyroidism 10/02/2006   Qualifier: Diagnosis of  By: Everardo All MD, Sean A    No past surgical history on file.  reports that she has never smoked. She has never used smokeless tobacco. Her alcohol and drug histories are not on file. family history includes Breast cancer in her maternal grandmother. Not on File Current Outpatient Prescriptions on File Prior to Visit  Medication Sig Dispense Refill  . guaiFENesin (MUCINEX) 600 MG 12 hr tablet Take 1 tablet (600 mg total) by mouth 2 (two)  times daily as needed for cough or to loosen phlegm. 14 tablet 0  . Multiple Vitamin (MULTIVITAMIN) tablet Take 1 tablet by mouth daily.    Marland Kitchen oxymetazoline (AFRIN NASAL SPRAY) 0.05 % nasal spray Place 1 spray into both nostrils 2 (two) times daily. Use only for 3days, then stop 30 mL 0   No current facility-administered medications on file prior to visit.    Review of Systems Constitutional: Negative for other unusual diaphoresis, sweats, appetite or weight changes HENT: Negative for other worsening hearing loss, ear pain, facial swelling, mouth sores or neck stiffness.   Eyes: Negative for other worsening pain, redness or other visual disturbance.  Respiratory: Negative for other stridor or swelling Cardiovascular: Negative for other palpitations or other chest pain  Gastrointestinal: Negative for worsening diarrhea or loose stools, blood in stool, distention or other pain Genitourinary: Negative for hematuria, flank pain or other change in urine volume.  Musculoskeletal: Negative for myalgias or other joint swelling.  Skin: Negative for other color change, or other wound or worsening drainage.  Neurological: Negative for other syncope or numbness. Hematological: Negative for other adenopathy or swelling Psychiatric/Behavioral: Negative for hallucinations, other worsening agitation, SI, self-injury, or new decreased concentration All other system neg per pt    Objective:   Physical Exam BP 120/80   Pulse (!) 117   Temp 98.2 F (36.8 C) (Oral)   Ht  (1.626 m)   Wt 215 lb (97.5 kg)   LMP 06/22/2011   SpO2 98%   BMI 36.90 kg/m  VS noted,  Constitutional: Pt is oriented to person, place, and time. Appears well-developed and well-nourished, in no significant distress and comfortable Head: Normocephalic and atraumatic  Eyes: Conjunctivae and EOM are normal. Pupils are equal, round, and reactive to light Bilat tm's with mild erythema.  Max sinus areas non tender.  Pharynx with  mild erythema, no exudate Right Ear: External ear normal without discharge Left Ear: External ear normal without discharge Nose: Nose without discharge or deformity Mouth/Throat: Oropharynx is without other ulcerations and moist  Neck: Normal range of motion. Neck supple. No JVD present. No tracheal deviation present or significant neck LA or mass Cardiovascular: Normal rate, regular rhythm, normal heart sounds and intact distal pulses.   Pulmonary/Chest: WOB normal and breath sounds without rales or wheezing  Abdominal: Soft. Bowel sounds are normal. NT. No HSM  Musculoskeletal: Normal range of motion. Exhibits no edema Lymphadenopathy: Has no other cervical adenopathy.  Neurological: Pt is alert and oriented to person, place, and time. Pt has normal reflexes. No cranial nerve deficit. Motor grossly intact, Gait intact Skin: Skin is warm and dry. No rash noted or new ulcerations Psychiatric:  Has normal mood and affect. Behavior is normal without agitation No other exam findings Lab Results  Component Value Date   WBC 4.8 08/23/2015   HGB 14.5 08/23/2015   HCT 41.8 08/23/2015   PLT 195.0 08/23/2015   GLUCOSE 97 08/23/2015   CHOL 216 (H) 08/23/2015   TRIG 67.0 08/23/2015   HDL 60.00 08/23/2015   LDLCALC 143 (H) 08/23/2015   ALT 15 08/23/2015   AST 19 08/23/2015   NA 139 08/23/2015   K 4.2 08/23/2015   CL 104 08/23/2015   CREATININE 0.68 08/23/2015   BUN 11 08/23/2015   CO2 29 08/23/2015   TSH 1.02 08/23/2015   HGBA1C 6.0 08/23/2015          Assessment & Plan:

## 2016-11-15 NOTE — Patient Instructions (Addendum)
You had the Tdap tetanus shot today  Please have your GYN send most recent lab results  Please take all new medication as prescribed - the prednisone  Please continue all other medications as before, and refills have been done if requested.  Please have the pharmacy call with any other refills you may need.  Please continue your efforts at being more active, low cholesterol diet, and weight control.  You are otherwise up to date with prevention measures today.  Please keep your appointments with your specialists as you may have planned  Please return in 1 year for your yearly visit, or sooner if needed, with Lab testing done 3-5 days before

## 2016-11-15 NOTE — Assessment & Plan Note (Signed)
Mild ot mod, for predpac asd,  to f/u any worsening symptoms or concerns

## 2016-11-20 ENCOUNTER — Telehealth: Payer: Self-pay | Admitting: Internal Medicine

## 2016-11-20 NOTE — Telephone Encounter (Signed)
Ok with me 

## 2016-11-20 NOTE — Telephone Encounter (Signed)
Pt would like to transfer from Dr. John to Dr. Banks is it okay? °

## 2016-11-28 NOTE — Telephone Encounter (Signed)
ok 

## 2016-11-28 NOTE — Telephone Encounter (Signed)
Please advise 

## 2016-11-28 NOTE — Telephone Encounter (Signed)
Ok per Dr. Salomon FickBanks.

## 2016-11-28 NOTE — Telephone Encounter (Signed)
Pt is aware of the above msg and will call back when she is ready.

## 2017-07-03 ENCOUNTER — Other Ambulatory Visit: Payer: Self-pay | Admitting: Obstetrics and Gynecology

## 2017-07-03 DIAGNOSIS — Z1231 Encounter for screening mammogram for malignant neoplasm of breast: Secondary | ICD-10-CM

## 2017-08-04 ENCOUNTER — Other Ambulatory Visit: Payer: Self-pay | Admitting: Internal Medicine

## 2017-08-04 DIAGNOSIS — J0140 Acute pansinusitis, unspecified: Secondary | ICD-10-CM

## 2017-08-12 ENCOUNTER — Ambulatory Visit
Admission: RE | Admit: 2017-08-12 | Discharge: 2017-08-12 | Disposition: A | Discharge status: Home / Self Care | Payer: BC Managed Care – PPO | Source: Ambulatory Visit | Attending: Obstetrics and Gynecology | Admitting: Obstetrics and Gynecology

## 2017-08-12 DIAGNOSIS — Z1231 Encounter for screening mammogram for malignant neoplasm of breast: Secondary | ICD-10-CM

## 2017-11-16 ENCOUNTER — Other Ambulatory Visit: Payer: Self-pay | Admitting: Internal Medicine

## 2017-11-20 ENCOUNTER — Ambulatory Visit (INDEPENDENT_AMBULATORY_CARE_PROVIDER_SITE_OTHER): Payer: BC Managed Care – PPO | Admitting: Family Medicine

## 2017-11-20 ENCOUNTER — Encounter: Payer: Self-pay | Admitting: Family Medicine

## 2017-11-20 VITALS — BP 98/64 | HR 104 | Temp 98.5°F | Ht 64.0 in | Wt 210.0 lb

## 2017-11-20 DIAGNOSIS — Z8639 Personal history of other endocrine, nutritional and metabolic disease: Secondary | ICD-10-CM

## 2017-11-20 DIAGNOSIS — Z13220 Encounter for screening for lipoid disorders: Secondary | ICD-10-CM | POA: Diagnosis not present

## 2017-11-20 DIAGNOSIS — Z131 Encounter for screening for diabetes mellitus: Secondary | ICD-10-CM

## 2017-11-20 DIAGNOSIS — I10 Essential (primary) hypertension: Secondary | ICD-10-CM

## 2017-11-20 DIAGNOSIS — J302 Other seasonal allergic rhinitis: Secondary | ICD-10-CM

## 2017-11-20 DIAGNOSIS — Z0000 Encounter for general adult medical examination without abnormal findings: Secondary | ICD-10-CM

## 2017-11-20 LAB — LIPID PANEL
CHOL/HDL RATIO: 4
Cholesterol: 224 mg/dL — ABNORMAL HIGH (ref 0–200)
HDL: 59.2 mg/dL (ref >39.00)
LDL CALC: 151 mg/dL — AB (ref 0–99)
NonHDL: 164.53
TRIGLYCERIDES: 69 mg/dL (ref 0.0–149.0)
VLDL: 13.8 mg/dL (ref 0.0–40.0)

## 2017-11-20 LAB — CBC WITH DIFFERENTIAL/PLATELET
BASOS ABS: 0 10*3/uL (ref 0.0–0.1)
Basophils Relative: 0.2 % (ref 0.0–3.0)
EOS PCT: 5.1 % — AB (ref 0.0–5.0)
Eosinophils Absolute: 0.2 10*3/uL (ref 0.0–0.7)
HEMATOCRIT: 42.4 % (ref 36.0–46.0)
Hemoglobin: 14.4 g/dL (ref 12.0–15.0)
LYMPHS PCT: 43.2 % (ref 12.0–46.0)
Lymphs Abs: 1.4 10*3/uL (ref 0.7–4.0)
MCHC: 34 g/dL (ref 30.0–36.0)
MCV: 91.8 fl (ref 78.0–100.0)
Monocytes Absolute: 0.3 10*3/uL (ref 0.1–1.0)
Monocytes Relative: 8.8 % (ref 3.0–12.0)
NEUTROS ABS: 1.4 10*3/uL (ref 1.4–7.7)
Neutrophils Relative %: 42.7 % — ABNORMAL LOW (ref 43.0–77.0)
Platelets: 233 10*3/uL (ref 150.0–400.0)
RBC: 4.61 Mil/uL (ref 3.87–5.11)
RDW: 13.5 % (ref 11.5–15.5)
WBC: 3.3 10*3/uL — ABNORMAL LOW (ref 4.0–10.5)

## 2017-11-20 LAB — BASIC METABOLIC PANEL
BUN: 10 mg/dL (ref 6–23)
CHLORIDE: 99 meq/L (ref 96–112)
CO2: 29 meq/L (ref 19–32)
CREATININE: 0.74 mg/dL (ref 0.40–1.20)
Calcium: 10 mg/dL (ref 8.4–10.5)
GFR: 107.37 mL/min (ref >60.00)
Glucose, Bld: 93 mg/dL (ref 70–99)
Potassium: 4 mEq/L (ref 3.5–5.1)
Sodium: 137 mEq/L (ref 135–145)

## 2017-11-20 LAB — T4, FREE: Free T4: 0.66 ng/dL (ref 0.60–1.60)

## 2017-11-20 LAB — HEMOGLOBIN A1C: Hgb A1c MFr Bld: 6 % (ref 4.6–6.5)

## 2017-11-20 LAB — TSH: TSH: 0.54 u[IU]/mL (ref 0.35–4.50)

## 2017-11-20 MED ORDER — AMILORIDE-HYDROCHLOROTHIAZIDE 5-50 MG PO TABS: ORAL_TABLET | ORAL | 3 refills | Status: DC

## 2017-11-20 MED ORDER — FLUTICASONE PROPIONATE 50 MCG/ACT NA SUSP: NASAL | 5 refills | Status: DC

## 2017-11-20 NOTE — Patient Instructions (Addendum)
Preventive Care 40-64 Years, Female Preventive care refers to lifestyle choices and visits with your health care provider that can promote health and wellness. What does preventive care include?  A yearly physical exam. This is also called an annual well check.  Dental exams once or twice a year.  Routine eye exams. Ask your health care provider how often you should have your eyes checked.  Personal lifestyle choices, including: ? Daily care of your teeth and gums. ? Regular physical activity. ? Eating a healthy diet. ? Avoiding tobacco and drug use. ? Limiting alcohol use. ? Practicing safe sex. ? Taking low-dose aspirin daily starting at age 58. ? Taking vitamin and mineral supplements as recommended by your health care provider. What happens during an annual well check? The services and screenings done by your health care provider during your annual well check will depend on your age, overall health, lifestyle risk factors, and family history of disease. Counseling Your health care provider may ask you questions about your:  Alcohol use.  Tobacco use.  Drug use.  Emotional well-being.  Home and relationship well-being.  Sexual activity.  Eating habits.  Work and work Statistician.  Method of birth control.  Menstrual cycle.  Pregnancy history.  Screening You may have the following tests or measurements:  Height, weight, and BMI.  Blood pressure.  Lipid and cholesterol levels. These may be checked every 5 years, or more frequently if you are over 81 years old.  Skin check.  Lung cancer screening. You may have this screening every year starting at age 78 if you have a 30-pack-year history of smoking and currently smoke or have quit within the past 15 years.  Fecal occult blood test (FOBT) of the stool. You may have this test every year starting at age 65.  Flexible sigmoidoscopy or colonoscopy. You may have a sigmoidoscopy every 5 years or a colonoscopy  every 10 years starting at age 30.  Hepatitis C blood test.  Hepatitis B blood test.  Sexually transmitted disease (STD) testing.  Diabetes screening. This is done by checking your blood sugar (glucose) after you have not eaten for a while (fasting). You may have this done every 1-3 years.  Mammogram. This may be done every 1-2 years. Talk to your health care provider about when you should start having regular mammograms. This may depend on whether you have a family history of breast cancer.  BRCA-related cancer screening. This may be done if you have a family history of breast, ovarian, tubal, or peritoneal cancers.  Pelvic exam and Pap test. This may be done every 3 years starting at age 80. Starting at age 36, this may be done every 5 years if you have a Pap test in combination with an HPV test.  Bone density scan. This is done to screen for osteoporosis. You may have this scan if you are at high risk for osteoporosis.  Discuss your test results, treatment options, and if necessary, the need for more tests with your health care provider. Vaccines Your health care provider may recommend certain vaccines, such as:  Influenza vaccine. This is recommended every year.  Tetanus, diphtheria, and acellular pertussis (Tdap, Td) vaccine. You may need a Td booster every 10 years.  Varicella vaccine. You may need this if you have not been vaccinated.  Zoster vaccine. You may need this after age 5.  Measles, mumps, and rubella (MMR) vaccine. You may need at least one dose of MMR if you were born in  1957 or later. You may also need a second dose.  Pneumococcal 13-valent conjugate (PCV13) vaccine. You may need this if you have certain conditions and were not previously vaccinated.  Pneumococcal polysaccharide (PPSV23) vaccine. You may need one or two doses if you smoke cigarettes or if you have certain conditions.  Meningococcal vaccine. You may need this if you have certain  conditions.  Hepatitis A vaccine. You may need this if you have certain conditions or if you travel or work in places where you may be exposed to hepatitis A.  Hepatitis B vaccine. You may need this if you have certain conditions or if you travel or work in places where you may be exposed to hepatitis B.  Haemophilus influenzae type b (Hib) vaccine. You may need this if you have certain conditions.  Talk to your health care provider about which screenings and vaccines you need and how often you need them. This information is not intended to replace advice given to you by your health care provider. Make sure you discuss any questions you have with your health care provider. Document Released: 02/18/2015 Document Revised: 10/12/2015 Document Reviewed: 11/23/2014 Elsevier Interactive Patient Education  2018 [employer].  Exercising to [employer] Exercising regularly is important. It has many health benefits, such as:  Improving your overall fitness, flexibility, and endurance.  Increasing your bone density.  Helping with weight control.  Decreasing your body fat.  Increasing your muscle strength.  Reducing stress and tension.  Improving your overall health.  In order to become healthy and stay healthy, it is recommended that you do moderate-intensity and vigorous-intensity exercise. You can tell that you are exercising at a moderate intensity if you have a higher heart rate and faster breathing, but you are still able to hold a conversation. You can tell that you are exercising at a vigorous intensity if you are breathing much harder and faster and cannot hold a conversation while exercising. How often should I exercise? Choose an activity that you enjoy and set realistic goals. Your health care provider can help you to make an activity plan that works for you. Exercise regularly as directed by your health care provider. This may include:  Doing resistance training twice each week,  such as: ? Push-ups. ? Sit-ups. ? Lifting weights. ? Using resistance bands.  Doing a given intensity of exercise for a given amount of time. Choose from these options: ? 150 minutes of moderate-intensity exercise every week. ? 75 minutes of vigorous-intensity exercise every week. ? A mix of moderate-intensity and vigorous-intensity exercise every week.  Children, pregnant women, people who are out of shape, people who are overweight, and older adults may need to consult a health care provider for individual recommendations. If you have any sort of medical condition, be sure to consult your health care provider before starting a new exercise program. What are some exercise ideas? Some moderate-intensity exercise ideas include:  Walking at a rate of 1 mile in 15 minutes.  Biking.  Hiking.  Golfing.  Dancing.  Some vigorous-intensity exercise ideas include:  Walking at a rate of at least 4.5 miles per hour.  Jogging or running at a rate of 5 miles per hour.  Biking at a rate of at least 10 miles per hour.  Lap swimming.  Roller-skating or in-line skating.  Cross-country skiing.  Vigorous competitive sports, such as football, basketball, and soccer.  Jumping rope.  Aerobic dancing.  What are some everyday activities that can help me to  get exercise?  [place] work, such as: ? Pushing a Conservation officer, nature. ? Raking and bagging leaves.  Washing and waxing your car.  Pushing a stroller.  Shoveling snow.  Gardening.  Washing windows or floors. How can I be more active in my day-to-day activities?  Use the stairs instead of the elevator.  Take a walk during your lunch break.  If you drive, park your car farther away from work or school.  If you take public transportation, get off one stop early and walk the rest of the way.  Make all of your phone calls while standing up and walking around.  Get up, stretch, and walk around every 30 minutes throughout the  day. What guidelines should I follow while exercising?  Do not exercise so much that you hurt yourself, feel dizzy, or get very short of breath.  Consult your health care provider before starting a new exercise program.  Wear comfortable clothes and shoes with good support.  Drink plenty of water while you exercise to prevent dehydration or heat stroke. Body water is lost during exercise and must be replaced.  Work out until you breathe faster and your heart beats faster. This information is not intended to replace advice given to you by your health care provider. Make sure you discuss any questions you have with your health care provider. Document Released: 02/24/2010 Document Revised: 06/30/2015 Document Reviewed: 06/25/2013 Elsevier Interactive Patient Education  [employer].

## 2017-11-20 NOTE — Progress Notes (Signed)
Subjective:     Danielle Day is a 47 y.o. female and is here for a comprehensive physical exam. The patient reports no problems.  Followed by Ob/Gyn and Endo.  Seasonal allergies: -Endorses current allergy symptoms such as nasal drainage -Currently taking Zyrtec and Flonase  HTN: -Taking amiloride-hydrochlorothiazide 5-50 milligrams taking half a tab daily -not checking bp at home  H/o hyperthyroidism: -states was on meds briefly, then levels returned to normal -last TSH checked 1 yr ago -was seen by Dr. Everardo All, now by Dr. Cleon Gustin  Social History   Socioeconomic History  . Marital status: Married    Spouse name: Not on file  . Number of children: Not on file  . Years of education: Not on file  . Highest education level: Not on file  Occupational History  . Not on file  Social Needs  . Financial resource strain: Not on file  . Food insecurity:    Worry: Not on file    Inability: Not on file  . Transportation needs:    Medical: Not on file    Non-medical: Not on file  Tobacco Use  . Smoking status: Never Smoker  . Smokeless tobacco: Never Used  Substance and Sexual Activity  . Alcohol use: Not on file  . Drug use: Not on file  . Sexual activity: Not on file  Lifestyle  . Physical activity:    Days per week: Not on file    Minutes per session: Not on file  . Stress: Not on file  Relationships  . Social connections:    Talks on phone: Not on file    Gets together: Not on file    Attends religious service: Not on file    Active member of club or organization: Not on file    Attends meetings of clubs or organizations: Not on file    Relationship status: Not on file  . Intimate partner violence:    Fear of current or ex partner: Not on file    Emotionally abused: Not on file    Physically abused: Not on file    Forced sexual activity: Not on file  Other Topics Concern  . Not on file  Social History Narrative  . Not on file   Health Maintenance  Topic  Date Due  . HIV Screening  02/09/1984  . INFLUENZA VACCINE  05/07/2018 (Originally 09/05/2017)  . PAP SMEAR  10/23/2020  . TETANUS/TDAP  11/16/2026    The following portions of the patient's history were reviewed and updated as appropriate: allergies, current medications, past family history, past medical history, past social history, past surgical history and problem list.  Review of Systems Pertinent items noted in HPI and remainder of comprehensive ROS otherwise negative.   Objective:    BP 98/64 (BP Location: Left Arm, Patient Position: Sitting, Cuff Size: Large)   Pulse (!) 104   Temp 98.5 F (36.9 C) (Oral)   Ht 5\' 4"  (1.626 m)   Wt 210 lb (95.3 kg)   LMP 06/22/2011   SpO2 95%   BMI 36.05 kg/m  General appearance: alert, cooperative, appears stated age and no distress Head: Normocephalic, without obvious abnormality, atraumatic Eyes: conjunctivae/corneas clear. PERRL, EOM's intact. Fundi benign. Ears: normal TM's and external ear canals both ears Nose: Nares normal. Septum midline. Mucosa normal. No drainage or sinus tenderness. Throat: lips, mucosa, and tongue normal; teeth and gums normal Neck: no adenopathy, no carotid bruit, no JVD, supple, symmetrical, trachea midline and thyroid not enlarged,  symmetric, no tenderness/mass/nodules Lungs: clear to auscultation bilaterally Heart: regular rate and rhythm, S1, S2 normal, no murmur, click, rub or gallop Abdomen: soft, non-tender; bowel sounds normal; no masses,  no organomegaly Extremities: extremities normal, atraumatic, no cyanosis or edema Skin: Skin color, texture, turgor normal. No rashes or lesions Neurologic: Alert and oriented X 3, normal strength and tone. Normal symmetric reflexes. Normal coordination and gait    Assessment:    Healthy female exam.      Plan:     Anticipatory guidance given including wearing seatbelts, smoke detectors in the home, increasing physical activity, increasing p.o. intake of water  and vegetables. -declines influenza vaccine -We will obtain labs including lipid panel, CBC, BMP, TSH, T4 free, hemoglobin A1c. -Given handout -Next CPE in 1 year See After Visit Summary for Counseling Recommendations    History of hyperthyroidism -Stable -We will obtain TSH and free T4 this visit  HTN -Continue amiloride-HCTZ 5-50 take half tab daily -Patient encouraged to check BP at home -Lifestyle modifications also encouraged  Follow-up PRN  Abbe Amsterdam, MD

## 2018-05-13 ENCOUNTER — Ambulatory Visit: Payer: BC Managed Care – PPO | Admitting: Family Medicine

## 2018-05-13 ENCOUNTER — Encounter: Payer: Self-pay | Admitting: Family Medicine

## 2018-05-13 ENCOUNTER — Other Ambulatory Visit: Payer: Self-pay

## 2018-05-13 VITALS — BP 116/74 | HR 108 | Temp 98.3°F | Ht 64.0 in | Wt 211.9 lb

## 2018-05-13 DIAGNOSIS — H6982 Other specified disorders of Eustachian tube, left ear: Secondary | ICD-10-CM

## 2018-05-13 NOTE — Progress Notes (Signed)
  Subjective:     Patient ID: Danielle Day, female   DOB: Sep 15, 1970, 48 y.o.   MRN: [id number]  HPI  Patient had presented with concerns of possible foreign body in the left ear.  She states on Friday she was getting ready and use some Q-tips in her ear.  When she went to throw a 1 of the Q-tip she noticed that the cottonball is missing.  She is had some mild left earache since then.  No drainage.  No fevers or chills.  She has some fullness in the left ear.  She does have chronic sinus issues and takes daily Claritin and Flonase.  No recent flying.  No hearing loss.  No vertigo.  Past Medical History:  Diagnosis Date  . Essential hypertension 10/02/2006   Qualifier: Diagnosis of  By: Everardo All MD, Cleophas Dunker   . GOITER, MULTINODULAR 09/11/2007   Qualifier: Diagnosis of  By: Everardo All MD, Cleophas Dunker   . HYPERGLYCEMIA 09/11/2007   Qualifier: Diagnosis of  By: Everardo All MD, Cleophas Dunker   . Hyperthyroidism 10/02/2006   Qualifier: Diagnosis of  By: Everardo All MD, Gregary Signs A    History reviewed. No pertinent surgical history.  reports that she has never smoked. She has never used smokeless tobacco. No history on file for alcohol and drug. family history includes Breast cancer in her maternal grandmother. Allergies  Allergen Reactions  . Aspirin Palpitations  . Caffeine Palpitations    Review of Systems  Constitutional: Negative for chills and fever.  HENT: Positive for congestion, ear pain and sinus pressure. Negative for ear discharge and hearing loss.   Respiratory: Negative for cough and shortness of breath.        Objective:   Physical Exam Constitutional:      Appearance: Normal appearance.  HENT:     Ears:     Comments: Eardrums are normal.  No foreign body in either ear canal.  Ear canal is normal.  No cerumen.  No effusion. Cardiovascular:     Rate and Rhythm: Normal rate and regular rhythm.  Neurological:     Mental Status: She is alert.        Assessment:     Probable eustachian tube  dysfunction left ear.  No evidence for foreign body    Plan:     -Reassurance -Continue Claritin and Flonase for allergy symptoms -Follow-up as needed  Kristian Covey MD [facility]

## 2018-05-13 NOTE — Patient Instructions (Signed)

## 2018-05-20 ENCOUNTER — Telehealth: Payer: Self-pay

## 2018-05-20 NOTE — Telephone Encounter (Signed)
Pt was seen at the office by dr Caryl Never on 05/13/2018

## 2018-06-19 ENCOUNTER — Encounter: Payer: Self-pay | Admitting: Family Medicine

## 2018-06-19 ENCOUNTER — Ambulatory Visit (INDEPENDENT_AMBULATORY_CARE_PROVIDER_SITE_OTHER): Payer: BC Managed Care – PPO | Admitting: Family Medicine

## 2018-06-19 ENCOUNTER — Other Ambulatory Visit: Payer: Self-pay

## 2018-06-19 DIAGNOSIS — J0110 Acute frontal sinusitis, unspecified: Secondary | ICD-10-CM

## 2018-06-19 MED ORDER — AMOXICILLIN 500 MG PO CAPS: 500.0000 mg | ORAL_CAPSULE | Freq: Two times a day (BID) | ORAL | 0 refills | Status: AC

## 2018-06-19 NOTE — Progress Notes (Signed)
Virtual Visit via Video Note  I connected with Danielle Celeste. Day on 06/19/18 at 11:30 AM EDT by a video enabled telemedicine application and verified that I am speaking with the correct person using two identifiers.  Location patient: home Location provider:work or home office Persons participating in the virtual visit: patient, provider  I discussed the limitations of evaluation and management by telemedicine and the availability of in person appointments. The patient expressed understanding and agreed to proceed.   HPI: Pt with a HA behind L eye off and on since Sunday.  The pain goes from behind her eye into the L parietal area of her head.  She has some sneezing, ear pain.  States nose feels "really dry"  Had some nasal drainage.  Tender in L frontal.  Notes increased pressure with leaning forward.  Denies Sore throat, cough, fever, or sick contacts.  Has a pet dog at home.   Tried Aleeve, Tylenol, and rest. Using Flonase, switched from claritin to zyxal for allergies.   ROS: See pertinent positives and negatives per HPI.  Past Medical History:  Diagnosis Date  . Essential hypertension 10/02/2006   Qualifier: Diagnosis of  By: Everardo All MD, Cleophas Dunker   . GOITER, MULTINODULAR 09/11/2007   Qualifier: Diagnosis of  By: Everardo All MD, Cleophas Dunker   . HYPERGLYCEMIA 09/11/2007   Qualifier: Diagnosis of  By: Everardo All MD, Cleophas Dunker   . Hyperthyroidism 10/02/2006   Qualifier: Diagnosis of  By: Everardo All MD, Sean A     No past surgical history on file.  Family History  Problem Relation Age of Onset  . Breast cancer Maternal Grandmother     SOCIAL HX:    Current Outpatient Medications:  .  amiloride-hydrochlorothiazide (MODURETIC) 5-50 MG tablet, 1/2 tab by mouth daily, Disp: 45 tablet, Rfl: 3 .  fluticasone (FLONASE) 50 MCG/ACT nasal spray, SHAKE LIQUID AND USE 2 SPRAYS IN EACH NOSTRIL DAILY, Disp: 16 g, Rfl: 5 .  Multiple Vitamin (MULTIVITAMIN) tablet, Take 1 tablet by mouth daily., Disp: , Rfl:  .   oxymetazoline (AFRIN NASAL SPRAY) 0.05 % nasal spray, Place 1 spray into both nostrils 2 (two) times daily. Use only for 3days, then stop, Disp: 30 mL, Rfl: 0  EXAM:  VITALS per patient if applicable:  RR between 12-20 bpm  GENERAL: alert, oriented, appears well and in no acute distress  HEENT: atraumatic, conjunctiva clear, no obvious abnormalities on inspection of external nose and ears.  Mill TTP of L frontal sinus when pt palpates.  NECK: normal movements of the head and neck  LUNGS: on inspection no signs of respiratory distress, breathing rate appears normal, no obvious gross SOB, gasping or wheezing  CV: no obvious cyanosis  MS: moves all visible extremities without noticeable abnormality  PSYCH/NEURO: pleasant and cooperative, no obvious depression or anxiety, speech and thought processing grossly intact  ASSESSMENT AND PLAN:  Discussed the following assessment and plan:  Acute frontal sinusitis, recurrence not specified  -suspect sinusitis  -discussed supportive care.  Ok to continue flonase and zyxal -consider saline nasal rinse -discussed wait and see rx.  If symptoms still present in 2 days start amoxicillin. - Plan: amoxicillin (AMOXIL) 500 MG capsule  F/u prn   I discussed the assessment and treatment plan with the patient. The patient was provided an opportunity to ask questions and all were answered. The patient agreed with the plan and demonstrated an understanding of the instructions.   The patient was advised to call back or seek an  in-person evaluation if the symptoms worsen or if the condition fails to improve as anticipated.   Billie Ruddy, MD

## 2018-09-04 ENCOUNTER — Other Ambulatory Visit: Payer: Self-pay | Admitting: Obstetrics and Gynecology

## 2018-09-04 DIAGNOSIS — Z1231 Encounter for screening mammogram for malignant neoplasm of breast: Secondary | ICD-10-CM

## 2018-10-17 ENCOUNTER — Ambulatory Visit
Admission: RE | Admit: 2018-10-17 | Discharge: 2018-10-17 | Disposition: A | Discharge status: Home / Self Care | Payer: BC Managed Care – PPO | Source: Ambulatory Visit | Attending: Obstetrics and Gynecology | Admitting: Obstetrics and Gynecology

## 2018-10-17 ENCOUNTER — Other Ambulatory Visit: Payer: Self-pay

## 2018-10-17 DIAGNOSIS — Z1231 Encounter for screening mammogram for malignant neoplasm of breast: Secondary | ICD-10-CM

## 2018-10-20 LAB — HM MAMMOGRAPHY

## 2018-11-05 ENCOUNTER — Encounter: Payer: Self-pay | Admitting: Family Medicine

## 2018-12-02 ENCOUNTER — Encounter: Payer: Self-pay | Admitting: Family Medicine

## 2018-12-02 ENCOUNTER — Other Ambulatory Visit: Payer: Self-pay

## 2018-12-02 ENCOUNTER — Telehealth (INDEPENDENT_AMBULATORY_CARE_PROVIDER_SITE_OTHER): Payer: BC Managed Care – PPO | Admitting: Family Medicine

## 2018-12-02 DIAGNOSIS — J309 Allergic rhinitis, unspecified: Secondary | ICD-10-CM

## 2018-12-02 DIAGNOSIS — R0982 Postnasal drip: Secondary | ICD-10-CM | POA: Diagnosis not present

## 2018-12-02 NOTE — Progress Notes (Signed)
Virtual Visit via Telephone Note  I connected with Danielle Day on 12/02/18 at  3:00 PM EDT by telephone and verified that I am speaking with the correct person using two identifiers.   I discussed the limitations, risks, security and privacy concerns of performing an evaluation and management service by telephone and the availability of in person appointments. I also discussed with the patient that there may be a patient responsible charge related to this service. The patient expressed understanding and agreed to proceed.  Location patient: home Location provider: work or home office Participants present for the call: patient, provider Patient did not have a visit in the prior 7 days to address this/these issue(s).   History of Present Illness: Pt is a 48 yo with pmh sig for HTN, multinodular goiter, allergic rhinitis, hypothyroidism seen for acute concern.  Pt with HA over L eye that moves around to the back of her head, pressure in head worse with bending head down, post nasal drainage x 2 days.  Denies rhinorrhea, congestion, cough, sore throat.  Taking Xyzal, flonase, Ibuprofen.  Pt concerned she has a sinus infection.   Observations/Objective: Patient sounds cheerful and well on the phone. I do not appreciate any SOB. Speech and thought processing are grossly intact. Patient reported vitals:  Assessment and Plan: Post-nasal drainage -Continue current meds.   -ok to use flonase BID.  Cautioned may cause epistaxis. -Also discussed Coricidin HBP for symptoms  Allergic rhinitis, unspecified seasonality, unspecified trigger -advised given duration of symptoms sinusitis less likely.  Discussed abx overuse/resistance. -Continue Xyzal, Flonase -Discussed using Flonase twice daily -Also discussed using saline nasal spray/rinse and steam -given precautions.  Will re-evaluate at the end of the week.   Follow Up Instructions: Pt to f/u in the next 2-3 days to re-evaluate  I did  not refer this patient for an OV in the next 24 hours for this/these issue(s).  I discussed the assessment and treatment plan with the patient. The patient was provided an opportunity to ask questions and all were answered. The patient agreed with the plan and demonstrated an understanding of the instructions.   The patient was advised to call back or seek an in-person evaluation if the symptoms worsen or if the condition fails to improve as anticipated.  I provided 13 minutes of non-face-to-face time during this encounter.   Billie Ruddy, MD

## 2018-12-27 ENCOUNTER — Other Ambulatory Visit: Payer: Self-pay

## 2018-12-27 DIAGNOSIS — Z20822 Contact with and (suspected) exposure to covid-19: Secondary | ICD-10-CM

## 2018-12-29 LAB — NOVEL CORONAVIRUS, NAA: SARS-CoV-2, NAA: NOT DETECTED

## 2019-01-14 ENCOUNTER — Other Ambulatory Visit: Payer: Self-pay

## 2019-01-15 ENCOUNTER — Ambulatory Visit (INDEPENDENT_AMBULATORY_CARE_PROVIDER_SITE_OTHER): Payer: BC Managed Care – PPO | Admitting: Family Medicine

## 2019-01-15 ENCOUNTER — Encounter: Payer: Self-pay | Admitting: Family Medicine

## 2019-01-15 VITALS — BP 122/74 | HR 105 | Temp 97.6°F | Ht 64.0 in | Wt 219.2 lb

## 2019-01-15 DIAGNOSIS — J0100 Acute maxillary sinusitis, unspecified: Secondary | ICD-10-CM | POA: Diagnosis not present

## 2019-01-15 DIAGNOSIS — Z0000 Encounter for general adult medical examination without abnormal findings: Secondary | ICD-10-CM | POA: Diagnosis not present

## 2019-01-15 DIAGNOSIS — R7303 Prediabetes: Secondary | ICD-10-CM | POA: Insufficient documentation

## 2019-01-15 DIAGNOSIS — E782 Mixed hyperlipidemia: Secondary | ICD-10-CM

## 2019-01-15 DIAGNOSIS — I10 Essential (primary) hypertension: Secondary | ICD-10-CM

## 2019-01-15 DIAGNOSIS — J302 Other seasonal allergic rhinitis: Secondary | ICD-10-CM | POA: Diagnosis not present

## 2019-01-15 DIAGNOSIS — E6609 Other obesity due to excess calories: Secondary | ICD-10-CM | POA: Diagnosis not present

## 2019-01-15 DIAGNOSIS — Z6836 Body mass index (BMI) 36.0-36.9, adult: Secondary | ICD-10-CM | POA: Diagnosis not present

## 2019-01-15 LAB — TSH: TSH: 0.49 u[IU]/mL (ref 0.35–4.50)

## 2019-01-15 LAB — CBC WITH DIFFERENTIAL/PLATELET
Basophils Absolute: 0 10*3/uL (ref 0.0–0.1)
Basophils Relative: 0.9 % (ref 0.0–3.0)
Eosinophils Absolute: 0.2 10*3/uL (ref 0.0–0.7)
Eosinophils Relative: 3.7 % (ref 0.0–5.0)
HCT: 42.1 % (ref 36.0–46.0)
Hemoglobin: 14.3 g/dL (ref 12.0–15.0)
Lymphocytes Relative: 36.9 % (ref 12.0–46.0)
Lymphs Abs: 1.5 10*3/uL (ref 0.7–4.0)
MCHC: 34.1 g/dL (ref 30.0–36.0)
MCV: 93.1 fl (ref 78.0–100.0)
Monocytes Absolute: 0.3 10*3/uL (ref 0.1–1.0)
Monocytes Relative: 8.4 % (ref 3.0–12.0)
Neutro Abs: 2.1 10*3/uL (ref 1.4–7.7)
Neutrophils Relative %: 50.1 % (ref 43.0–77.0)
Platelets: 215 10*3/uL (ref 150.0–400.0)
RBC: 4.53 Mil/uL (ref 3.87–5.11)
RDW: 13.6 % (ref 11.5–15.5)
WBC: 4.1 10*3/uL (ref 4.0–10.5)

## 2019-01-15 LAB — LIPID PANEL
Cholesterol: 215 mg/dL — ABNORMAL HIGH (ref 0–200)
HDL: 62.8 mg/dL (ref >39.00)
LDL Cholesterol: 141 mg/dL — ABNORMAL HIGH (ref 0–99)
NonHDL: 152.55
Total CHOL/HDL Ratio: 3
Triglycerides: 56 mg/dL (ref 0.0–149.0)
VLDL: 11.2 mg/dL (ref 0.0–40.0)

## 2019-01-15 LAB — BASIC METABOLIC PANEL
BUN: 11 mg/dL (ref 6–23)
CO2: 27 mEq/L (ref 19–32)
Calcium: 9.9 mg/dL (ref 8.4–10.5)
Chloride: 101 mEq/L (ref 96–112)
Creatinine, Ser: 0.71 mg/dL (ref 0.40–1.20)
GFR: 105.46 mL/min (ref >60.00)
Glucose, Bld: 105 mg/dL — ABNORMAL HIGH (ref 70–99)
Potassium: 3.9 mEq/L (ref 3.5–5.1)
Sodium: 137 mEq/L (ref 135–145)

## 2019-01-15 LAB — HEMOGLOBIN A1C: Hgb A1c MFr Bld: 6.1 % (ref 4.6–6.5)

## 2019-01-15 LAB — T4, FREE: Free T4: 0.69 ng/dL (ref 0.60–1.60)

## 2019-01-15 MED ORDER — AMILORIDE-HYDROCHLOROTHIAZIDE 5-50 MG PO TABS: ORAL_TABLET | ORAL | 3 refills | Status: DC

## 2019-01-15 MED ORDER — LEVOCETIRIZINE DIHYDROCHLORIDE 5 MG PO TABS: 5.0000 mg | ORAL_TABLET | Freq: Every evening | ORAL | 5 refills | Status: DC

## 2019-01-15 MED ORDER — FLUTICASONE PROPIONATE 50 MCG/ACT NA SUSP: NASAL | 5 refills | Status: DC

## 2019-01-15 MED ORDER — AMOXICILLIN-POT CLAVULANATE 875-125 MG PO TABS: 1.0000 | ORAL_TABLET | Freq: Two times a day (BID) | ORAL | 0 refills | Status: DC

## 2019-01-15 NOTE — Progress Notes (Signed)
Subjective:     Danielle Day is a 48 y.o. female and is here for a comprehensive physical exam. The patient reports problems - allergies.  Pt notes xyzal has helped some.  Also using flonase.  Pt has a Pensions consultant, as carpet and hardwood floors at home.  In the past was getting allergy shots.  States some days are better than others.  Has "heavy" feeling in head, rhinorrhea, pressure in ears.  Pt had a mammogram 10/20/2018.  Pt had recent labs with OB/GYN.  Pt unsure of the exact blood work done.  Pt does not get influenza vaccines.  Social History   Socioeconomic History  . Marital status: Married    Spouse name: Not on file  . Number of children: Not on file  . Years of education: Not on file  . Highest education level: Not on file  Occupational History  . Not on file  Tobacco Use  . Smoking status: Never Smoker  . Smokeless tobacco: Never Used  Substance and Sexual Activity  . Alcohol use: Not on file  . Drug use: Not on file  . Sexual activity: Not on file  Other Topics Concern  . Not on file  Social History Narrative  . Not on file   Social Determinants of Health   Financial Resource Strain:   . Difficulty of Paying Living Expenses: Not on file  Food Insecurity:   . Worried About Programme researcher, broadcasting/film/video in the Last Year: Not on file  . Ran Out of Food in the Last Year: Not on file  Transportation Needs:   . Lack of Transportation (Medical): Not on file  . Lack of Transportation (Non-Medical): Not on file  Physical Activity:   . Days of Exercise per Week: Not on file  . Minutes of Exercise per Session: Not on file  Stress:   . Feeling of Stress : Not on file  Social Connections:   . Frequency of Communication with Friends and Family: Not on file  . Frequency of Social Gatherings with Friends and Family: Not on file  . Attends Religious Services: Not on file  . Active Member of Clubs or Organizations: Not on file  . Attends Banker Meetings: Not on file   . Marital Status: Not on file  Intimate Partner Violence:   . Fear of Current or Ex-Partner: Not on file  . Emotionally Abused: Not on file  . Physically Abused: Not on file  . Sexually Abused: Not on file   Health Maintenance  Topic Date Due  . HIV Screening  02/09/1984  . INFLUENZA VACCINE  05/06/2019 (Originally 09/06/2018)  . PAP SMEAR-Modifier  10/23/2020  . TETANUS/TDAP  11/16/2026    The following portions of the patient's history were reviewed and updated as appropriate: allergies, current medications, past family history, past medical history, past social history, past surgical history and problem list.  Review of Systems Pertinent items noted in HPI and remainder of comprehensive ROS otherwise negative.   Objective:    BP 122/74 (BP Location: Right Arm, Patient Position: Sitting, Cuff Size: Large)   Pulse (!) 105   Temp 97.6 F (36.4 C) (Temporal)   Ht 5\' 4"  (1.626 m)   Wt 219 lb 3.2 oz (99.4 kg)   LMP 06/22/2011   SpO2 96%   BMI 37.63 kg/m  General appearance: alert, cooperative and no distress Head: Normocephalic, without obvious abnormality, atraumatic TTP of L maxillary sinus Eyes: conjunctivae/corneas clear. PERRL, EOM's intact. Fundi  benign. Ears: b/l TM's full.  external ear canals both ears normal. Nose: Nares normal. Septum midline. Mucosa normal. No drainage or sinus tenderness. Throat: lips, mucosa, and tongue normal; teeth and gums normal Neck: no adenopathy, no carotid bruit, no JVD, supple, symmetrical, trachea midline and thyroid not enlarged, symmetric, no tenderness/mass/nodules Lungs: clear to auscultation bilaterally Heart: regular rate and rhythm, S1, S2 normal, no murmur, click, rub or gallop Abdomen: soft, non-tender; bowel sounds normal; no masses,  no organomegaly Extremities: extremities normal, atraumatic, no cyanosis or edema Pulses: 2+ and symmetric Skin: Skin color, texture, turgor normal. No rashes or lesions Lymph nodes: Cervical,  supraclavicular, and axillary nodes normal. Neurologic: Alert and oriented X 3, normal strength and tone. Normal symmetric reflexes. Normal coordination and gait    Assessment:    Healthy female exam with subacute sinusitis. Plan:     Anticipatory guidance given including wearing seatbelts, smoke detectors in the home, increasing physical activity, increasing p.o. intake of water and vegetables. -Pt had labs done last month with OB/Gyn.   -pap and mammogram up to date -Declines influenza vaccine -Given handouts -Next CPE in 1 year See After Visit Summary for Counseling Recommendations    Essential hypertension  -controlled -Continue amiloride-hydrochlorothiazide 5-50 mg, take half tab daily - Plan: amiloride-hydrochlorothiazide (MODURETIC) 5-50 MG tablet, Basic Metabolic Panel  Seasonal allergies  -Discussed ways to reduce symptoms -Given handout -for continued allergy issues discussed referral to ENT or allergist. - Plan: fluticasone (FLONASE) 50 MCG/ACT nasal spray, levocetirizine (XYZAL) 5 MG tablet  Subacute maxillary sinusitis  - Plan: amoxicillin-clavulanate (AUGMENTIN) 875-125 MG tablet  Prediabetes  - Plan: Hemoglobin A1c  Class 2 obesity due to excess calories without serious comorbidity with body mass index (BMI) of 36.0 to 36.9 in adult  -Discussed lifestyle modifications - Plan: TSH, T4, Free, Lipid Panel  Mixed hyperlipidemia  - Plan: Lipid Panel  Follow-up as needed in 1 month  Grier Mitts, MD  This note is not being shared with the patient for the following reason: To prevent harm (release of this note would result in harm to the life or physical safety of the patient or another).

## 2019-01-15 NOTE — Patient Instructions (Signed)
Preventive Care 40-49 Years Old, Female °Preventive care refers to visits with your health care provider and lifestyle choices that can promote health and wellness. This includes: °· A yearly physical exam. This may also be called an annual well check. °· Regular dental visits and eye exams. °· Immunizations. °· Screening for certain conditions. °· Healthy lifestyle choices, such as eating a healthy diet, getting regular exercise, not using drugs or products that contain nicotine and tobacco, and limiting alcohol use. °What can I expect for my preventive care visit? °Physical exam °Your health care provider will check your: °· Height and weight. This may be used to calculate body mass index (BMI), which tells if you are at a healthy weight. °· Heart rate and blood pressure. °· Skin for abnormal spots. °Counseling °Your health care provider may ask you questions about your: °· Alcohol, tobacco, and drug use. °· Emotional well-being. °· Home and relationship well-being. °· Sexual activity. °· Eating habits. °· Work and work environment. °· Method of birth control. °· Menstrual cycle. °· Pregnancy history. °What immunizations do I need? ° °Influenza (flu) vaccine °· This is recommended every year. °Tetanus, diphtheria, and pertussis (Tdap) vaccine °· You may need a Td booster every 10 years. °Varicella (chickenpox) vaccine °· You may need this if you have not been vaccinated. °Zoster (shingles) vaccine °· You may need this after age 60. °Measles, mumps, and rubella (MMR) vaccine °· You may need at least one dose of MMR if you were born in 1957 or later. You may also need a second dose. °Pneumococcal conjugate (PCV13) vaccine °· You may need this if you have certain conditions and were not previously vaccinated. °Pneumococcal polysaccharide (PPSV23) vaccine °· You may need one or two doses if you smoke cigarettes or if you have certain conditions. °Meningococcal conjugate (MenACWY) vaccine °· You may need this if you  have certain conditions. °Hepatitis A vaccine °· You may need this if you have certain conditions or if you travel or work in places where you may be exposed to hepatitis A. °Hepatitis B vaccine °· You may need this if you have certain conditions or if you travel or work in places where you may be exposed to hepatitis B. °Haemophilus influenzae type b (Hib) vaccine °· You may need this if you have certain conditions. °Human papillomavirus (HPV) vaccine °· If recommended by your health care provider, you may need three doses over 6 months. °You may receive vaccines as individual doses or as more than one vaccine together in one shot (combination vaccines). Talk with your health care provider about the risks and benefits of combination vaccines. °What tests do I need? °Blood tests °· Lipid and cholesterol levels. These may be checked every 5 years, or more frequently if you are over 50 years old. °· Hepatitis C test. °· Hepatitis B test. °Screening °· Lung cancer screening. You may have this screening every year starting at age 55 if you have a 30-pack-year history of smoking and currently smoke or have quit within the past 15 years. °· Colorectal cancer screening. All adults should have this screening starting at age 50 and continuing until age 75. Your health care provider may recommend screening at age 45 if you are at increased risk. You will have tests every 1-10 years, depending on your results and the type of screening test. °· Diabetes screening. This is done by checking your blood sugar (glucose) after you have not eaten for a while (fasting). You may have this   done every 1-3 years.  Mammogram. This may be done every 1-2 years. Talk with your health care provider about when you should start having regular mammograms. This may depend on whether you have a family history of breast cancer.  BRCA-related cancer screening. This may be done if you have a family history of breast, ovarian, tubal, or peritoneal  cancers.  Pelvic exam and Pap test. This may be done every 3 years starting at age 35. Starting at age 68, this may be done every 5 years if you have a Pap test in combination with an HPV test. Other tests  Sexually transmitted disease (STD) testing.  Bone density scan. This is done to screen for osteoporosis. You may have this scan if you are at high risk for osteoporosis. Follow these instructions at home: Eating and drinking  Eat a diet that includes fresh fruits and vegetables, whole grains, lean protein, and low-fat dairy.  Take vitamin and mineral supplements as recommended by your health care provider.  Do not drink alcohol if: ? Your health care provider tells you not to drink. ? You are pregnant, may be pregnant, or are planning to become pregnant.  If you drink alcohol: ? Limit how much you have to 0-1 drink a day. ? Be aware of how much alcohol is in your drink. In the [place] one drink equals one 12 oz bottle of beer (355 mL), one 5 oz glass of wine (148 mL), or one 1 oz glass of hard liquor (44 mL). Lifestyle  Take daily care of your teeth and gums.  Stay active. Exercise for at least 30 minutes on 5 or more days each week.  Do not use any products that contain nicotine or tobacco, such as cigarettes, e-cigarettes, and chewing tobacco. If you need help quitting, ask your health care provider.  If you are sexually active, practice safe sex. Use a condom or other form of birth control (contraception) in order to prevent pregnancy and STIs (sexually transmitted infections).  If told by your health care provider, take low-dose aspirin daily starting at age 71. What's next?  Visit your health care provider once a year for a well check visit.  Ask your health care provider how often you should have your eyes and teeth checked.  Stay up to date on all vaccines. This information is not intended to replace advice given to you by your health care provider. Make sure you  discuss any questions you have with your health care provider. Document Released: 02/18/2015 Document Revised: 10/03/2017 Document Reviewed: 10/03/2017 Elsevier Patient Education  2020 [employer].  Preventing Type 2 Diabetes Mellitus Type 2 diabetes (type 2 diabetes mellitus) is a long-term (chronic) disease that affects blood sugar (glucose) levels. Normally, a hormone called insulin allows glucose to enter cells in the body. The cells use glucose for energy. In type 2 diabetes, one or both of these problems may be present:  The body does not make enough insulin.  The body does not respond properly to insulin that it makes (insulin resistance). Insulin resistance or lack of insulin causes excess glucose to build up in the blood instead of going into cells. As a result, high blood glucose (hyperglycemia) develops, which can cause many complications. Being overweight or obese and having an inactive (sedentary) lifestyle can increase your risk for diabetes. Type 2 diabetes can be delayed or prevented by making certain nutrition and lifestyle changes. What nutrition changes can be made?   Eat healthy meals and snacks  regularly. Keep a healthy snack with you for when you get hungry between meals, such as fruit or a handful of nuts.  Eat lean meats and proteins that are low in saturated fats, such as chicken, fish, egg whites, and beans. Avoid processed meats.  Eat plenty of fruits and vegetables and plenty of grains that have not been processed (whole grains). It is recommended that you eat: ? 1?2 cups of fruit every day. ? 2?3 cups of vegetables every day. ? 6?8 oz of whole grains every day, such as oats, whole wheat, bulgur, brown rice, quinoa, and millet.  Eat low-fat dairy products, such as milk, yogurt, and cheese.  Eat foods that contain healthy fats, such as nuts, avocado, olive oil, and canola oil.  Drink water throughout the day. Avoid drinks that contain added sugar, such as  soda or sweet tea.  Follow instructions from your health care provider about specific eating or drinking restrictions.  Control how much food you eat at a time (portion size). ? Check food labels to find out the serving sizes of foods. ? Use a kitchen scale to weigh amounts of foods.  Saute or steam food instead of frying it. Cook with water or broth instead of oils or butter.  Limit your intake of: ? Salt (sodium). Have no more than 1 tsp (2,400 mg) of sodium a day. If you have heart disease or high blood pressure, have less than ? tsp (1,500 mg) of sodium a day. ? Saturated fat. This is fat that is solid at room temperature, such as butter or fat on meat. What lifestyle changes can be made? Activity   Do moderate-intensity physical activity for at least 30 minutes on at least 5 days of the week, or as much as told by your health care provider.  Ask your health care provider what activities are safe for you. A mix of physical activities may be best, such as walking, swimming, cycling, and strength training.  Try to add physical activity into your day. For example: ? Park in spots that are farther away than usual, so that you walk more. For example, park in a far corner of the parking lot when you go to the office or the grocery store. ? Take a walk during your lunch break. ? Use stairs instead of elevators or escalators. Weight Loss  Lose weight as directed. Your health care provider can determine how much weight loss is best for you and can help you lose weight safely.  If you are overweight or obese, you may be instructed to lose at least 5?7 % of your body weight. Alcohol and Tobacco   Limit alcohol intake to no more than 1 drink a day for nonpregnant women and 2 drinks a day for men. One drink equals 12 oz of beer, 5 oz of wine, or 1 oz of hard liquor.  Do not use any tobacco products, such as cigarettes, chewing tobacco, and e-cigarettes. If you need help quitting, ask  your health care provider. Work With [place] Provider  Have your blood glucose tested regularly, as told by your health care provider.  Discuss your risk factors and how you can reduce your risk for diabetes.  Get screening tests as told by your health care provider. You may have screening tests regularly, especially if you have certain risk factors for type 2 diabetes.  Make an appointment with a diet and nutrition specialist (registered dietitian). A registered dietitian can help you make a  healthy eating plan and can help you understand portion sizes and food labels. Why are these changes important?  It is possible to prevent or delay type 2 diabetes and related health problems by making lifestyle and nutrition changes.  It can be difficult to recognize signs of type 2 diabetes. The best way to avoid possible damage to your body is to take actions to prevent the disease before you develop symptoms. What can happen if changes are not made?  Your blood glucose levels may keep increasing. Having high blood glucose for a long time is dangerous. Too much glucose in your blood can damage your blood vessels, heart, kidneys, nerves, and eyes.  You may develop prediabetes or type 2 diabetes. Type 2 diabetes can lead to many chronic health problems and complications, such as: ? Heart disease. ? Stroke. ? Blindness. ? Kidney disease. ? Depression. ? Poor circulation in the feet and legs, which could lead to surgical removal (amputation) in severe cases. Where to find support  Ask your health care provider to recommend a registered dietitian, diabetes educator, or weight loss program.  Look for local or online weight loss groups.  Join a gym, fitness club, or outdoor activity group, such as a walking club. Where to find more information To learn more about diabetes and diabetes prevention, visit:  American Diabetes Association (ADA): www.diabetes.[employer] of  Diabetes and Digestive and Kidney Diseases: FindSpin.nl To learn more about healthy eating, visit:  The [place] Department of Agriculture Scientist, research (physical sciences)), Choose My Plate: http://wiley-williams.com/  Office of Disease Prevention and Health Promotion (ODPHP), Dietary Guidelines: SurferLive.at Summary  You can reduce your risk for type 2 diabetes by increasing your physical activity, eating healthy foods, and losing weight as directed.  Talk with your health care provider about your risk for type 2 diabetes. Ask about any blood tests or screening tests that you need to have. This information is not intended to replace advice given to you by your health care provider. Make sure you discuss any questions you have with your health care provider. Document Released: 05/16/2015 Document Revised: 05/16/2018 Document Reviewed: 03/15/2015 Elsevier Patient Education  2020 [employer]. Allergic Rhinitis, Adult Allergic rhinitis is an allergic reaction that affects the mucous membrane inside the nose. It causes sneezing, a runny or stuffy nose, and the feeling of mucus going down the back of the throat (postnasal drip). Allergic rhinitis can be mild to severe. There are two types of allergic rhinitis:  Seasonal. This type is also called hay fever. It happens only during certain seasons.  Perennial. This type can happen at any time of the year. What are the causes? This condition happens when the body's defense system (immune system) responds to certain harmless substances called allergens as though they were germs.  Seasonal allergic rhinitis is triggered by pollen, which can come from grasses, trees, and weeds. Perennial allergic rhinitis may be caused by:  House dust mites.  Pet dander.  Mold spores. What are the signs or symptoms? Symptoms of this condition include:  Sneezing.  Runny or stuffy nose (nasal congestion).  Postnasal  drip.  Itchy nose.  Tearing of the eyes.  Trouble sleeping.  Daytime sleepiness. How is this diagnosed? This condition may be diagnosed based on:  Your medical history.  A physical exam.  Tests to check for related conditions, such as: ? Asthma. ? Pink eye. ? Ear infection. ? Upper respiratory infection.  Tests to find out which allergens trigger your symptoms. These may  include skin or blood tests. How is this treated? There is no cure for this condition, but treatment can help control symptoms. Treatment may include:  Taking medicines that block allergy symptoms, such as antihistamines. Medicine may be given as a shot, nasal spray, or pill.  Avoiding the allergen.  Desensitization. This treatment involves getting ongoing shots until your body becomes less sensitive to the allergen. This treatment may be done if other treatments do not help.  If taking medicine and avoiding the allergen does not work, new, stronger medicines may be prescribed. Follow these instructions at home:  Find out what you are allergic to. Common allergens include smoke, dust, and pollen.  Avoid the things you are allergic to. These are some things you can do to help avoid allergens: ? Replace carpet with wood, tile, or vinyl flooring. Carpet can trap dander and dust. ? Do not smoke. Do not allow smoking in your home. ? Change your heating and air conditioning filter at least once a month. ? During allergy season:  Keep windows closed as much as possible.  Plan outdoor activities when pollen counts are lowest. This is usually during the evening hours.  When coming indoors, change clothing and shower before sitting on furniture or bedding.  Take over-the-counter and prescription medicines only as told by your health care provider.  Keep all follow-up visits as told by your health care provider. This is important. Contact a health care provider if:  You have a fever.  You develop a  persistent cough.  You make whistling sounds when you breathe (you wheeze).  Your symptoms interfere with your normal daily activities. Get help right away if:  You have shortness of breath. Summary  This condition can be managed by taking medicines as directed and avoiding allergens.  Contact your health care provider if you develop a persistent cough or fever.  During allergy season, keep windows closed as much as possible. This information is not intended to replace advice given to you by your health care provider. Make sure you discuss any questions you have with your health care provider. Document Released: 10/17/2000 Document Revised: 01/04/2017 Document Reviewed: 03/01/2016 Elsevier Patient Education  2020 [employer].  Managing Your Hypertension Hypertension is commonly called high blood pressure. This is when the force of your blood pressing against the walls of your arteries is too strong. Arteries are blood vessels that carry blood from your heart throughout your body. Hypertension forces the heart to work harder to pump blood, and may cause the arteries to become narrow or stiff. Having untreated or uncontrolled hypertension can cause heart attack, stroke, kidney disease, and other problems. What are blood pressure readings? A blood pressure reading consists of a higher number over a lower number. Ideally, your blood pressure should be below 120/80. The first ("top") number is called the systolic pressure. It is a measure of the pressure in your arteries as your heart beats. The second ("bottom") number is called the diastolic pressure. It is a measure of the pressure in your arteries as the heart relaxes. What does my blood pressure reading mean? Blood pressure is classified into four stages. Based on your blood pressure reading, your health care provider may use the following stages to determine what type of treatment you need, if any. Systolic pressure and diastolic pressure  are measured in a unit called mm Hg. Normal  Systolic pressure: below 166.  Diastolic pressure: below 80. Elevated  Systolic pressure: 063-016.  Diastolic pressure: below 80.  Hypertension stage 1  Systolic pressure: 202-542.  Diastolic pressure: 70-62. Hypertension stage 2  Systolic pressure: 376 or above.  Diastolic pressure: 90 or above. What health risks are associated with hypertension? Managing your hypertension is an important responsibility. Uncontrolled hypertension can lead to:  A heart attack.  A stroke.  A weakened blood vessel (aneurysm).  Heart failure.  Kidney damage.  Eye damage.  Metabolic syndrome.  Memory and concentration problems. What changes can I make to manage my hypertension? Hypertension can be managed by making lifestyle changes and possibly by taking medicines. Your health care provider will help you make a plan to bring your blood pressure within a normal range. Eating and drinking   Eat a diet that is high in fiber and potassium, and low in salt (sodium), added sugar, and fat. An example eating plan is called the DASH (Dietary Approaches to Stop Hypertension) diet. To eat this way: ? Eat plenty of fresh fruits and vegetables. Try to fill half of your plate at each meal with fruits and vegetables. ? Eat whole grains, such as whole wheat pasta, brown rice, or whole grain bread. Fill about one quarter of your plate with whole grains. ? Eat low-fat diary products. ? Avoid fatty cuts of meat, processed or cured meats, and poultry with skin. Fill about one quarter of your plate with lean proteins such as fish, chicken without skin, beans, eggs, and tofu. ? Avoid premade and processed foods. These tend to be higher in sodium, added sugar, and fat.  Reduce your daily sodium intake. Most people with hypertension should eat less than 1,500 mg of sodium a day.  Limit alcohol intake to no more than 1 drink a day for nonpregnant women and 2 drinks  a day for men. One drink equals 12 oz of beer, 5 oz of wine, or 1 oz of hard liquor. Lifestyle  Work with your health care provider to maintain a healthy body weight, or to lose weight. Ask what an ideal weight is for you.  Get at least 30 minutes of exercise that causes your heart to beat faster (aerobic exercise) most days of the week. Activities may include walking, swimming, or biking.  Include exercise to strengthen your muscles (resistance exercise), such as weight lifting, as part of your weekly exercise routine. Try to do these types of exercises for 30 minutes at least 3 days a week.  Do not use any products that contain nicotine or tobacco, such as cigarettes and e-cigarettes. If you need help quitting, ask your health care provider.  Control any long-term (chronic) conditions you have, such as high cholesterol or diabetes. Monitoring  Monitor your blood pressure at home as told by your health care provider. Your personal target blood pressure may vary depending on your medical conditions, your age, and other factors.  Have your blood pressure checked regularly, as often as told by your health care provider. Working with your health care provider  Review all the medicines you take with your health care provider because there may be side effects or interactions.  Talk with your health care provider about your diet, exercise habits, and other lifestyle factors that may be contributing to hypertension.  Visit your health care provider regularly. Your health care provider can help you create and adjust your plan for managing hypertension. Will I need medicine to control my blood pressure? Your health care provider may prescribe medicine if lifestyle changes are not enough to get your blood pressure under control, and  if:  Your systolic blood pressure is 130 or higher.  Your diastolic blood pressure is 80 or higher. Take medicines only as told by your health care provider. Follow  the directions carefully. Blood pressure medicines must be taken as prescribed. The medicine does not work as well when you skip doses. Skipping doses also puts you at risk for problems. Contact a health care provider if:  You think you are having a reaction to medicines you have taken.  You have repeated (recurrent) headaches.  You feel dizzy.  You have swelling in your ankles.  You have trouble with your vision. Get help right away if:  You develop a severe headache or confusion.  You have unusual weakness or numbness, or you feel faint.  You have severe pain in your chest or abdomen.  You vomit repeatedly.  You have trouble breathing. Summary  Hypertension is when the force of blood pumping through your arteries is too strong. If this condition is not controlled, it may put you at risk for serious complications.  Your personal target blood pressure may vary depending on your medical conditions, your age, and other factors. For most people, a normal blood pressure is less than 120/80.  Hypertension is managed by lifestyle changes, medicines, or both. Lifestyle changes include weight loss, eating a healthy, low-sodium diet, exercising more, and limiting alcohol. This information is not intended to replace advice given to you by your health care provider. Make sure you discuss any questions you have with your health care provider. Document Released: 10/17/2011 Document Revised: 05/16/2018 Document Reviewed: 12/21/2015 Elsevier Patient Education  2020 [employer].

## 2019-08-05 ENCOUNTER — Other Ambulatory Visit: Payer: Self-pay | Admitting: Obstetrics and Gynecology

## 2019-09-29 ENCOUNTER — Other Ambulatory Visit: Payer: Self-pay | Admitting: Obstetrics and Gynecology

## 2019-09-29 DIAGNOSIS — Z1231 Encounter for screening mammogram for malignant neoplasm of breast: Secondary | ICD-10-CM

## 2019-10-20 ENCOUNTER — Other Ambulatory Visit: Payer: Self-pay

## 2019-10-20 ENCOUNTER — Ambulatory Visit
Admission: RE | Admit: 2019-10-20 | Discharge: 2019-10-20 | Disposition: A | Discharge status: Home / Self Care | Payer: BC Managed Care – PPO | Source: Ambulatory Visit | Attending: Obstetrics and Gynecology | Admitting: Obstetrics and Gynecology

## 2019-10-20 DIAGNOSIS — Z1231 Encounter for screening mammogram for malignant neoplasm of breast: Secondary | ICD-10-CM

## 2020-01-18 ENCOUNTER — Encounter: Payer: BC Managed Care – PPO | Admitting: Family Medicine

## 2020-01-20 ENCOUNTER — Ambulatory Visit (INDEPENDENT_AMBULATORY_CARE_PROVIDER_SITE_OTHER): Payer: BC Managed Care – PPO | Admitting: Family Medicine

## 2020-01-20 ENCOUNTER — Encounter: Payer: Self-pay | Admitting: Family Medicine

## 2020-01-20 ENCOUNTER — Other Ambulatory Visit: Payer: Self-pay

## 2020-01-20 VITALS — BP 110/76 | HR 108 | Temp 98.6°F | Ht 64.0 in | Wt 219.0 lb

## 2020-01-20 DIAGNOSIS — I10 Essential (primary) hypertension: Secondary | ICD-10-CM

## 2020-01-20 DIAGNOSIS — J302 Other seasonal allergic rhinitis: Secondary | ICD-10-CM | POA: Diagnosis not present

## 2020-01-20 DIAGNOSIS — Z0000 Encounter for general adult medical examination without abnormal findings: Secondary | ICD-10-CM

## 2020-01-20 DIAGNOSIS — E0590 Thyrotoxicosis, unspecified without thyrotoxic crisis or storm: Secondary | ICD-10-CM

## 2020-01-20 DIAGNOSIS — E782 Mixed hyperlipidemia: Secondary | ICD-10-CM | POA: Diagnosis not present

## 2020-01-20 DIAGNOSIS — E6609 Other obesity due to excess calories: Secondary | ICD-10-CM

## 2020-01-20 DIAGNOSIS — Z6837 Body mass index (BMI) 37.0-37.9, adult: Secondary | ICD-10-CM

## 2020-01-20 MED ORDER — MONTELUKAST SODIUM 10 MG PO TABS: 10.0000 mg | ORAL_TABLET | Freq: Every day | ORAL | 3 refills | Status: DC

## 2020-01-20 MED ORDER — AMILORIDE-HYDROCHLOROTHIAZIDE 5-50 MG PO TABS: ORAL_TABLET | ORAL | 3 refills | Status: DC

## 2020-01-20 MED ORDER — FLUTICASONE PROPIONATE 50 MCG/ACT NA SUSP: NASAL | 11 refills | Status: DC

## 2020-01-20 NOTE — Progress Notes (Signed)
Subjective:     Danielle Day is a 49 y.o. female and is here for a comprehensive physical exam. The patient reports problems - allergies.  Switching from Zyrtec and Xyzal.  Also using Flonase.  Has never tried Singulair.  Otherwise doing well.  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  Endorses weight gain.  Has a gym membership but has not been in a while.  Notes BP controlled for the most part on amiloride-hydrochlorothiazide 5-50 mg, half tab daily.  Mammogram up-to-date.  Patient needs to reschedule colonoscopy as planned in 09-12-22, however pt had a death in her family.  Recommended immunizations reviewed.  Patient mentions she had chickenpox in her 63s.  Social History   Socioeconomic History  . Marital status: Married    Spouse name: Not on file  . Number of children: Not on file  . Years of education: Not on file  . Highest education level: Not on file  Occupational History  . Not on file  Tobacco Use  . Smoking status: Never Smoker  . Smokeless tobacco: Never Used  Substance and Sexual Activity  . Alcohol use: Not on file  . Drug use: Not on file  . Sexual activity: Not on file  Other Topics Concern  . Not on file  Social History Narrative  . Not on file   Social Determinants of Health   Financial Resource Strain: Not on file  Food Insecurity: Not on file  Transportation Needs: Not on file  Physical Activity: Not on file  Stress: Not on file  Social Connections: Not on file  Intimate Partner Violence: Not on file   Health Maintenance  Topic Date Due  . Hepatitis C Screening  Never done  . HIV Screening  Never done  . COLONOSCOPY  Never done  . COVID-19 Vaccine (2 - Pfizer 3-dose booster series) 08/29/2019  . INFLUENZA VACCINE  05/05/2020 (Originally 09/06/2019)  . PAP SMEAR-Modifier  10/23/2020  . MAMMOGRAM  10/19/2021  . TETANUS/TDAP  11/16/2026    The following portions of the patient's history were reviewed and updated as appropriate: allergies, current  medications, past family history, past medical history, past social history, past surgical history and problem list.  Review of Systems Pertinent items noted in HPI and remainder of comprehensive ROS otherwise negative.   Objective:    BP 110/76 (BP Location: Left Arm, Patient Position: Sitting, Cuff Size: Large)   Pulse (!) 108   Temp 98.6 F (37 C) (Oral)   Ht 5' 4" (1.626 m)   Wt 219 lb (99.3 kg)   LMP 06/22/2011 Comment: Postmenopausal  SpO2 99%   BMI 37.59 kg/m  General appearance: alert, cooperative and no distress Head: Normocephalic, without obvious abnormality, atraumatic Eyes: conjunctivae/corneas clear. PERRL, EOM's intact. Fundi benign. Ears: normal TM's and external ear canals both ears Nose: Nares normal. Septum midline. Mucosa normal. No drainage or sinus tenderness. Throat: lips, mucosa, and tongue normal; teeth and gums normal Neck: no adenopathy, no carotid bruit, no JVD, supple, symmetrical, trachea midline and thyroid not enlarged, symmetric, no tenderness/mass/nodules Lungs: clear to auscultation bilaterally Heart: regular rate and rhythm, S1, S2 normal, no murmur, click, rub or gallop Abdomen: soft, non-tender; bowel sounds normal; no masses,  no organomegaly Extremities: extremities normal, atraumatic, no cyanosis or edema Pulses: 2+ and symmetric Skin: Skin color, texture, turgor normal. No rashes or lesions Lymph nodes: Cervical, supraclavicular, and axillary nodes normal. Neurologic: Alert and oriented X 3, normal strength and tone. Normal symmetric reflexes. Normal  coordination and gait    Assessment:    Healthy female exam.      Plan:     Anticipatory guidance given including wearing seatbelts, smoke detectors in the home, increasing physical activity, increasing p.o. intake of water and vegetables. -We will obtain labs -Mammogram up-to-date -Patient to reschedule colonoscopy as was due in July -Pap done 04/17/2010 with OB/GYN.  Follow-up with OB  for Pap -Offered influenza vaccine, patient declines. -Given information about other immunizations including pneumococcal, shingles. -Given handout -Next CPE in 1 year  Essential hypertension  -controlled -Continue regular medications -Continue amiloride-hydrochlorothiazide 5-50 mg half tablet daily - Plan: CBC with Differential/Platelet, CMP with eGFR([facility]), amiloride-hydrochlorothiazide (MODURETIC) 5-50 MG tablet  Seasonal allergies  -Given continued allergy symptoms will start Singulair -Okay to continue Flonase as needed -For worsening or continued symptoms obtain CT sinus and referral to ENT - Plan: montelukast (SINGULAIR) 10 MG tablet, fluticasone (FLONASE) 50 MCG/ACT nasal spray  Mixed hyperlipidemia -lifestyle modifications encouraged - Plan: Lipid panel, CMP with eGFR([facility])  Hyperthyroidism - Plan: TSH, T4, free  Class 2 obesity due to excess calories without serious comorbidity with body mass index (BMI) of 37.0 to 37.9 in adult -Lifestyle modifications encouraged including increasing physical activity - Plan: Hemoglobin A1c  Follow-up in the next 3 months, sooner if needed  Grier Mitts, MD

## 2020-01-20 NOTE — Patient Instructions (Signed)
Preventive Care 50-50 Years Old, Female Preventive care refers to visits with your health care provider and lifestyle choices that can promote health and wellness. This includes:  A yearly physical exam. This may also be called an annual well check.  Regular dental visits and eye exams.  Immunizations.  Screening for certain conditions.  Healthy lifestyle choices, such as eating a healthy diet, getting regular exercise, not using drugs or products that contain nicotine and tobacco, and limiting alcohol use. What can I expect for my preventive care visit? Physical exam Your health care provider will check your:  Height and weight. This may be used to calculate body mass index (BMI), which tells if you are at a healthy weight.  Heart rate and blood pressure.  Skin for abnormal spots. Counseling Your health care provider may ask you questions about your:  Alcohol, tobacco, and drug use.  Emotional well-being.  Home and relationship well-being.  Sexual activity.  Eating habits.  Work and work environment.  Method of birth control.  Menstrual cycle.  Pregnancy history. What immunizations do I need?  Influenza (flu) vaccine  This is recommended every year. Tetanus, diphtheria, and pertussis (Tdap) vaccine  You may need a Td booster every 10 years. Varicella (chickenpox) vaccine  You may need this if you have not been vaccinated. Zoster (shingles) vaccine  You may need this after age 60. Measles, mumps, and rubella (MMR) vaccine  You may need at least one dose of MMR if you were born in 1957 or later. You may also need a second dose. Pneumococcal conjugate (PCV13) vaccine  You may need this if you have certain conditions and were not previously vaccinated. Pneumococcal polysaccharide (PPSV23) vaccine  You may need one or two doses if you smoke cigarettes or if you have certain conditions. Meningococcal conjugate (MenACWY) vaccine  You may need this if you  have certain conditions. Hepatitis A vaccine  You may need this if you have certain conditions or if you travel or work in places where you may be exposed to hepatitis A. Hepatitis B vaccine  You may need this if you have certain conditions or if you travel or work in places where you may be exposed to hepatitis B. Haemophilus influenzae type b (Hib) vaccine  You may need this if you have certain conditions. Human papillomavirus (HPV) vaccine  If recommended by your health care provider, you may need three doses over 6 months. You may receive vaccines as individual doses or as more than one vaccine together in one shot (combination vaccines). Talk with your health care provider about the risks and benefits of combination vaccines. What tests do I need? Blood tests  Lipid and cholesterol levels. These may be checked every 5 years, or more frequently if you are over 50 years old.  Hepatitis C test.  Hepatitis B test. Screening  Lung cancer screening. You may have this screening every year starting at age 55 if you have a 30-pack-year history of smoking and currently smoke or have quit within the past 15 years.  Colorectal cancer screening. All adults should have this screening starting at age 50 and continuing until age 75. Your health care provider may recommend screening at age 45 if you are at increased risk. You will have tests every 1-10 years, depending on your results and the type of screening test.  Diabetes screening. This is done by checking your blood sugar (glucose) after you have not eaten for a while (fasting). You may have this   done every 1-3 years.  Mammogram. This may be done every 1-2 years. Talk with your health care provider about when you should start having regular mammograms. This may depend on whether you have a family history of breast cancer.  BRCA-related cancer screening. This may be done if you have a family history of breast, ovarian, tubal, or peritoneal  cancers.  Pelvic exam and Pap test. This may be done every 3 years starting at age 20. Starting at age 30, this may be done every 5 years if you have a Pap test in combination with an HPV test. Other tests  Sexually transmitted disease (STD) testing.  Bone density scan. This is done to screen for osteoporosis. You may have this scan if you are at high risk for osteoporosis. Follow these instructions at home: Eating and drinking  Eat a diet that includes fresh fruits and vegetables, whole grains, lean protein, and low-fat dairy.  Take vitamin and mineral supplements as recommended by your health care provider.  Do not drink alcohol if: ? Your health care provider tells you not to drink. ? You are pregnant, may be pregnant, or are planning to become pregnant.  If you drink alcohol: ? Limit how much you have to 0-1 drink a day. ? Be aware of how much alcohol is in your drink. In the [place] one drink equals one 12 oz bottle of beer (355 mL), one 5 oz glass of wine (148 mL), or one 1 oz glass of hard liquor (44 mL). Lifestyle  Take daily care of your teeth and gums.  Stay active. Exercise for at least 30 minutes on 5 or more days each week.  Do not use any products that contain nicotine or tobacco, such as cigarettes, e-cigarettes, and chewing tobacco. If you need help quitting, ask your health care provider.  If you are sexually active, practice safe sex. Use a condom or other form of birth control (contraception) in order to prevent pregnancy and STIs (sexually transmitted infections).  If told by your health care provider, take low-dose aspirin daily starting at age 50. What's next?  Visit your health care provider once a year for a well check visit.  Ask your health care provider how often you should have your eyes and teeth checked.  Stay up to date on all vaccines. This information is not intended to replace advice given to you by your health care provider. Make sure you  discuss any questions you have with your health care provider. Document Revised: 10/03/2017 Document Reviewed: 10/03/2017 Elsevier Patient Education  [place].  Immunization Schedule, 52-92 Years Old  Vaccines are usually given at various ages, according to a schedule. Your health care provider will recommend vaccines for you based on your age, medical history, and lifestyle or other factors such as travel or where you work. You may receive vaccines as individual doses or as more than one vaccine together in one shot (combination vaccines). Talk with your health care provider about the risks and benefits of combination vaccines. Recommended immunizations for 20-66 years old Influenza vaccine  You should get a dose of the influenza vaccine every year. Tetanus, diphtheria, and pertussis vaccine A vaccine that protects against tetanus, diphtheria, and pertussis is known as the Tdap vaccine. A vaccine that protects against tetanus and diphtheria is known as the Td vaccine.  You should only get the Td vaccine if you have had at least 1 dose of the Tdap vaccine.  You should get 1 dose  of the Td or Tdap vaccine every 10 years, or you should get 1 dose of the Tdap vaccine if: ? You have not previously gotten a Tdap vaccine. ? You do not know if you have ever gotten a Tdap vaccine. Zoster vaccine This is also known as the RZV vaccine. You should get 2 doses of the RZV vaccine 2 to 6 months apart. It is important to get the RZV vaccine even if you:  Have had shingles.  Have received the ZVL vaccine, an older version of the RZV vaccine.  Are unsure if you have had chickenpox (varicella). Pneumococcal conjugate vaccine This is also known as the PCV13 vaccine. You should get the PCV13 vaccine as recommended if you have certain high-risk conditions. These include:  Diabetes.  Chronic conditions of the heart, lungs, or liver.  Conditions that affect the body's disease-fighting system  (immune system). Pneumococcal polysaccharide vaccine This is also known as the PPSV23 vaccine. You should get the PPSV23 vaccine as recommended if you have certain high-risk conditions. These include:  Diabetes.  Chronic conditions of the heart, lungs, or liver.  Conditions that affect the immune system. Hepatitis A vaccine This is also known as the HepA vaccine. If you did not get the HepA vaccine previously, you should get it if:  You are at risk for a hepatitis A infection. You may be at risk for infection if you: ? Have chronic liver disease. ? Have HIV or AIDS. ? Are a man who has sex with men. ? Use drugs. ? Are homeless. ? May be exposed to hepatitis A through work. ? Travel to countries where hepatitis A is common. ? Have or will have close contact with someone who was adopted from another country.  You are not at risk for infection but want protection from hepatitis A. Hepatitis B vaccine This is also known as the HepB vaccine. If you did not get the HepB vaccine previously, you should get it if:  You are at risk for hepatitis B infection. You are at risk if you: ? Have chronic liver disease. ? Have HIV or AIDS. ? Have sex with a partner who has hepatitis B, or:  You have multiple sex partners.  You are a man who has sex with men. ? Use drugs. ? May be exposed to hepatitis B through work. ? Live with someone who has hepatitis B. ? Receive dialysis treatment. ? Have diabetes. ? Travel to countries where hepatitis B is common.  You are not at risk of infection but want protection from hepatitis B. Measles, mumps, and rubella vaccine This is also known as the MMR vaccine. You may need to get the MMR vaccine if you were born in 1957 or later and:  You need to catch up on doses you missed in the past.  You have not been given the vaccine before.  You do not have evidence of immunity (by a blood test). Varicella vaccine This is also known as the VAR vaccine. You  may need to get the VAR vaccine if you do not have evidence of immunity (by a blood test) and you may be exposed to varicella through work. This is especially important if you work in a health care setting. Meningococcal conjugate vaccine This is also known as the MenACWY vaccine. You may need to get the MenACWY vaccine if you:  Have not been given the vaccine before.  Need to catch up on doses you missed in the past. This vaccine is  especially important if you:  Do not have a spleen.  Have sickle cell disease.  Have HIV.  Take medicines that suppress your immune system.  Travel to countries where meningococcal disease is common.  Are exposed to Neisseria meningitidis at work. Serogroup B meningococcal vaccine This is also known as the MenB vaccine. You may need to get the MenB vaccine if you:  Have not been given the vaccine before.  Need to catch up on doses you missed in the past. This vaccine is especially important if you:  Do not have a spleen.  Have sickle cell disease.  Take medicines that suppress your immune system.  Are exposed to Neisseria meningitidis at work. Haemophilus influenzae type b vaccine This is also known as the Hib vaccine. Anyone older than 50 years of age is usually not given the Hib vaccine. However, if you have certain high-risk conditions, you may need to get this vaccine. These conditions include:  Not having a spleen.  Having received a stem cell transplant. Before you get a vaccine: Talk with your health care provider about which vaccines are right for you. This is especially important if:  You previously had a reaction after getting a vaccine.  You have a weakened immune system. You may have a weakened immune system if you: ? Are taking medicines that reduce (suppress) the activity of your immune system. ? Are taking medicines to treat cancer (chemotherapy). ? Have HIV or AIDS.  You work in an environment where you may be exposed to  a disease.  You plan to travel outside of the country.  You have a chronic illness, such as heart disease, kidney disease, diabetes, or lung disease. Summary  Before you get a vaccine, tell your health care provider if you have reacted to vaccines in the past or have a condition that weakens your immune system.  At 50-64 years, you should get a dose of the flu vaccine every year and a dose of the Td or Tdap vaccine every 10 years.  You should get 2 doses of the RZV vaccine 2 to 6 months apart.  Depending on your medical history and your risk factors, you may need other vaccines. Ask your health care provider whether you are up to date on all your vaccines. This information is not intended to replace advice given to you by your health care provider. Make sure you discuss any questions you have with your health care provider. Document Revised: 11/18/2018 Document Reviewed: 11/18/2018 Elsevier Patient Education  [place]. Montelukast oral tablets What is this medicine? MONTELUKAST (mon te LOO kast) is used to prevent and treat the symptoms of asthma. It is also used to treat allergies. Do not use for an acute asthma attack. This medicine may be used for other purposes; ask your health care provider or pharmacist if you have questions. COMMON BRAND NAME(S): Singulair What should I tell my health care provider before I take this medicine? They need to know if you have any of these conditions:  liver disease  an unusual or allergic reaction to montelukast, other medicines, foods, dyes, or preservatives  pregnant or trying to get pregnant  breast-feeding How should I use this medicine? This medicine should be given by mouth. Follow the directions on the prescription label. Take this medicine at the same time every day. You may take this medicine with or without meals. Do not chew the tablets. Do not stop taking your medicine unless your doctor tells you to. Talk  to your  pediatrician regarding the use of this medicine in children. Special care may be needed. While this drug may be prescribed for children as young as 53 years of age for selected conditions, precautions do apply. Overdosage: If you think you have taken too much of this medicine contact a poison control center or emergency room at once. NOTE: This medicine is only for you. Do not share this medicine with others. What if I miss a dose? If you miss a dose, skip it. Take your next dose at the normal time. Do not take extra or 2 doses at the same time to make up for the missed dose. What may interact with this medicine?  anti-infectives like rifampin and rifabutin  medicines for seizures like phenytoin, phenobarbital, and carbamazepine This list may not describe all possible interactions. Give your health care provider a list of all the medicines, herbs, non-prescription drugs, or dietary supplements you use. Also tell them if you smoke, drink alcohol, or use illegal drugs. Some items may interact with your medicine. What should I watch for while using this medicine? Visit your doctor or health care professional for regular checks on your progress. Tell your doctor or health care professional if your allergy or asthma symptoms do not improve. Take your medicine even when you do not have symptoms. Do not stop taking any of your medicine(s) unless your doctor tells you to. If you have asthma, talk to your doctor about what to do in an acute asthma attack. Always have your inhaled rescue medicine for asthma attacks with you. Patients and their families should watch for new or worsening thoughts of suicide or depression. Also watch for sudden changes in feelings such as feeling anxious, agitated, panicky, irritable, hostile, aggressive, impulsive, severely restless, overly excited and hyperactive, or not being able to sleep. Any worsening of mood or thoughts of suicide or dying should be reported to your health  care professional right away. What side effects may I notice from receiving this medicine? Side effects that you should report to your doctor or health care professional as soon as possible:  allergic reactions like skin rash or hives, or swelling of the face, lips, or tongue  breathing problems  changes in emotions or moods  confusion  depressed mood  fever or infection  hallucinations  joint pain  painful lumps under the skin  pain, tingling, numbness in the hands or feet  redness, blistering, peeling, or loosening of the skin, including inside the mouth  restlessness  seizures  sleep walking  signs and symptoms of infection like fever; chills; cough; sore throat; flu-like illness  signs and symptoms of liver injury like dark yellow or brown urine; general ill feeling or flu-like symptoms; light-colored stools; loss of appetite; nausea; right upper belly pain; unusually weak or tired; yellowing of the eyes or skin  sinus pain or swelling  stuttering  suicidal thoughts or other mood changes  tremors  trouble sleeping  uncontrolled muscle movements  unusual bleeding or bruising  vivid or bad dreams Side effects that usually do not require medical attention (report to your doctor or health care professional if they continue or are bothersome):  dizziness  drowsiness  headache  runny nose  stomach upset  tiredness This list may not describe all possible side effects. Call your doctor for medical advice about side effects. You may report side effects to FDA at 1-800-FDA-1088. Where should I keep my medicine? Keep out of the reach of children. Store  at room temperature between 15 and 30 degrees C (59 and 86 degrees F). Protect from light and moisture. Keep this medicine in the original bottle. Throw away any unused medicine after the expiration date. NOTE: This sheet is a summary. It may not cover all possible information. If you have questions about  this medicine, talk to your doctor, pharmacist, or health care provider.  2020 Elsevier/Gold Standard (2018-05-23 12:54:33)

## 2020-01-25 ENCOUNTER — Other Ambulatory Visit: Payer: Self-pay | Admitting: Family Medicine

## 2020-01-25 ENCOUNTER — Other Ambulatory Visit: Payer: BC Managed Care – PPO

## 2020-01-25 ENCOUNTER — Other Ambulatory Visit: Payer: Self-pay

## 2020-01-25 DIAGNOSIS — E0590 Thyrotoxicosis, unspecified without thyrotoxic crisis or storm: Secondary | ICD-10-CM

## 2020-01-25 DIAGNOSIS — E782 Mixed hyperlipidemia: Secondary | ICD-10-CM

## 2020-01-25 DIAGNOSIS — Z0000 Encounter for general adult medical examination without abnormal findings: Secondary | ICD-10-CM

## 2020-01-25 DIAGNOSIS — I10 Essential (primary) hypertension: Secondary | ICD-10-CM

## 2020-01-26 LAB — CBC WITH DIFFERENTIAL/PLATELET
Absolute Monocytes: 352 cells/uL (ref 200–950)
Basophils Absolute: 52 cells/uL (ref 0–200)
Basophils Relative: 1.4 %
Eosinophils Absolute: 141 cells/uL (ref 15–500)
Eosinophils Relative: 3.8 %
HCT: 40.2 % (ref 35.0–45.0)
Hemoglobin: 14.2 g/dL (ref 11.7–15.5)
Lymphs Abs: 1610 cells/uL (ref 850–3900)
MCH: 31.9 pg (ref 27.0–33.0)
MCHC: 35.3 g/dL (ref 32.0–36.0)
MCV: 90.3 fL (ref 80.0–100.0)
MPV: 11.1 fL (ref 7.5–12.5)
Monocytes Relative: 9.5 %
Neutro Abs: 1547 cells/uL (ref 1500–7800)
Neutrophils Relative %: 41.8 %
Platelets: 222 10*3/uL (ref 140–400)
RBC: 4.45 10*6/uL (ref 3.80–5.10)
RDW: 12.6 % (ref 11.0–15.0)
Total Lymphocyte: 43.5 %
WBC: 3.7 10*3/uL — ABNORMAL LOW (ref 3.8–10.8)

## 2020-01-26 LAB — COMPLETE METABOLIC PANEL WITH GFR
AG Ratio: 1.3 (calc) (ref 1.0–2.5)
ALT: 18 U/L (ref 6–29)
AST: 19 U/L (ref 10–35)
Albumin: 4.3 g/dL (ref 3.6–5.1)
Alkaline phosphatase (APISO): 51 U/L (ref 37–153)
BUN: 9 mg/dL (ref 7–25)
CO2: 25 mmol/L (ref 20–32)
Calcium: 9.9 mg/dL (ref 8.6–10.4)
Chloride: 100 mmol/L (ref 98–110)
Creat: 0.69 mg/dL (ref 0.50–1.05)
GFR, Est African American: 118 mL/min/{1.73_m2} (ref >=60)
GFR, Est Non African American: 102 mL/min/{1.73_m2} (ref >=60)
Globulin: 3.2 g/dL (calc) (ref 1.9–3.7)
Glucose, Bld: 97 mg/dL (ref 65–99)
Potassium: 3.8 mmol/L (ref 3.5–5.3)
Sodium: 136 mmol/L (ref 135–146)
Total Bilirubin: 0.5 mg/dL (ref 0.2–1.2)
Total Protein: 7.5 g/dL (ref 6.1–8.1)

## 2020-01-26 LAB — LIPID PANEL
Cholesterol: 227 mg/dL — ABNORMAL HIGH (ref <200)
HDL: 66 mg/dL (ref >=50)
LDL Cholesterol (Calc): 143 mg/dL (calc) — ABNORMAL HIGH
Non-HDL Cholesterol (Calc): 161 mg/dL (calc) — ABNORMAL HIGH (ref <130)
Total CHOL/HDL Ratio: 3.4 (calc) (ref <5.0)
Triglycerides: 76 mg/dL (ref <150)

## 2020-01-26 LAB — TSH: TSH: 0.62 mIU/L

## 2020-01-26 LAB — T4, FREE: Free T4: 1.1 ng/dL (ref 0.8–1.8)

## 2020-02-21 ENCOUNTER — Other Ambulatory Visit: Payer: Self-pay | Admitting: Family Medicine

## 2020-02-21 DIAGNOSIS — J302 Other seasonal allergic rhinitis: Secondary | ICD-10-CM

## 2020-06-15 ENCOUNTER — Telehealth (INDEPENDENT_AMBULATORY_CARE_PROVIDER_SITE_OTHER): Payer: Self-pay | Admitting: Family Medicine

## 2020-06-15 ENCOUNTER — Encounter: Payer: Self-pay | Admitting: Family Medicine

## 2020-06-15 VITALS — Temp 100.0°F

## 2020-06-15 DIAGNOSIS — U071 COVID-19: Secondary | ICD-10-CM

## 2020-06-15 MED ORDER — MOLNUPIRAVIR EUA 200MG CAPSULE: 4.0000 | ORAL_CAPSULE | Freq: Two times a day (BID) | ORAL | 0 refills | Status: AC

## 2020-06-15 NOTE — Progress Notes (Signed)
Virtual Visit via Telephone Note  I connected with Danielle Day on 06/15/20 at  4:00 PM EDT by telephone and verified that I am speaking with the correct person using two identifiers.   I discussed the limitations, risks, security and privacy concerns of performing an evaluation and management service by telephone and the availability of in person appointments. I also discussed with the patient that there may be a patient responsible charge related to this service. The patient expressed understanding and agreed to proceed.  Location patient: home Location provider: work or home office Participants present for the call: patient, provider Patient did not have a visit in the prior 7 days to address this/these issue(s).   History of Present Illness: Pt hadn't been feeling well x 2 days.  Had 2 positive home covid test this morning.  Pt with sore throat, productive cough, HA, felt feverish Tmax 100.3 F, diarrhea.  Pt's appetite decreased.  Denies rhinorrhea, nasal congestion, n/v.  Pt taking ibuprofen and tylenol, Vitamin C and Zinc.  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    Pt had 2 COVID vaccines.  She was waiting to get her booster vaccine.     Observations/Objective: Patient sounds cheerful and well on the phone. I do not appreciate any SOB. Speech and thought processing are grossly intact. Patient reported vitals:   Assessment and Plan: COVID-19 virus infection  -home COVID test positive x 2 today -symptomatic treatment: tylenol, gargling with warm salt water Chloraseptic spray, OTC cough/cold medication, rest, hydration -Discussed r/b/a of molnupiravir -Given strict precautions -Advised on waiting perior for COVID-19 vaccine booster - Plan: molnupiravir EUA 200 mg CAPS  Follow Up Instructions:  F/u prn   [id number] [id number] [id number] 11-20 [id number] 21-30 I did not refer this patient for an OV in the next 24 hours for this/these issue(s).  I discussed the assessment  and treatment plan with the patient. The patient was provided an opportunity to ask questions and all were answered. The patient agreed with the plan and demonstrated an understanding of the instructions.   The patient was advised to call back or seek an in-person evaluation if the symptoms worsen or if the condition fails to improve as anticipated.  I provided 11:42 minutes of non-face-to-face time during this encounter.   Deeann Saint, MD

## 2020-08-31 ENCOUNTER — Other Ambulatory Visit: Payer: Self-pay | Admitting: Obstetrics and Gynecology

## 2020-08-31 DIAGNOSIS — Z1231 Encounter for screening mammogram for malignant neoplasm of breast: Secondary | ICD-10-CM

## 2020-10-08 IMAGING — MG MM DIGITAL SCREENING BILAT W/ CAD
4 series · 4 of 4 positions shown · non-contrast
Comparison: Previous exam(s).

CLINICAL DATA: Screening.

EXAM:
DIGITAL SCREENING BILATERAL MAMMOGRAM WITH CAD

[L MLO]
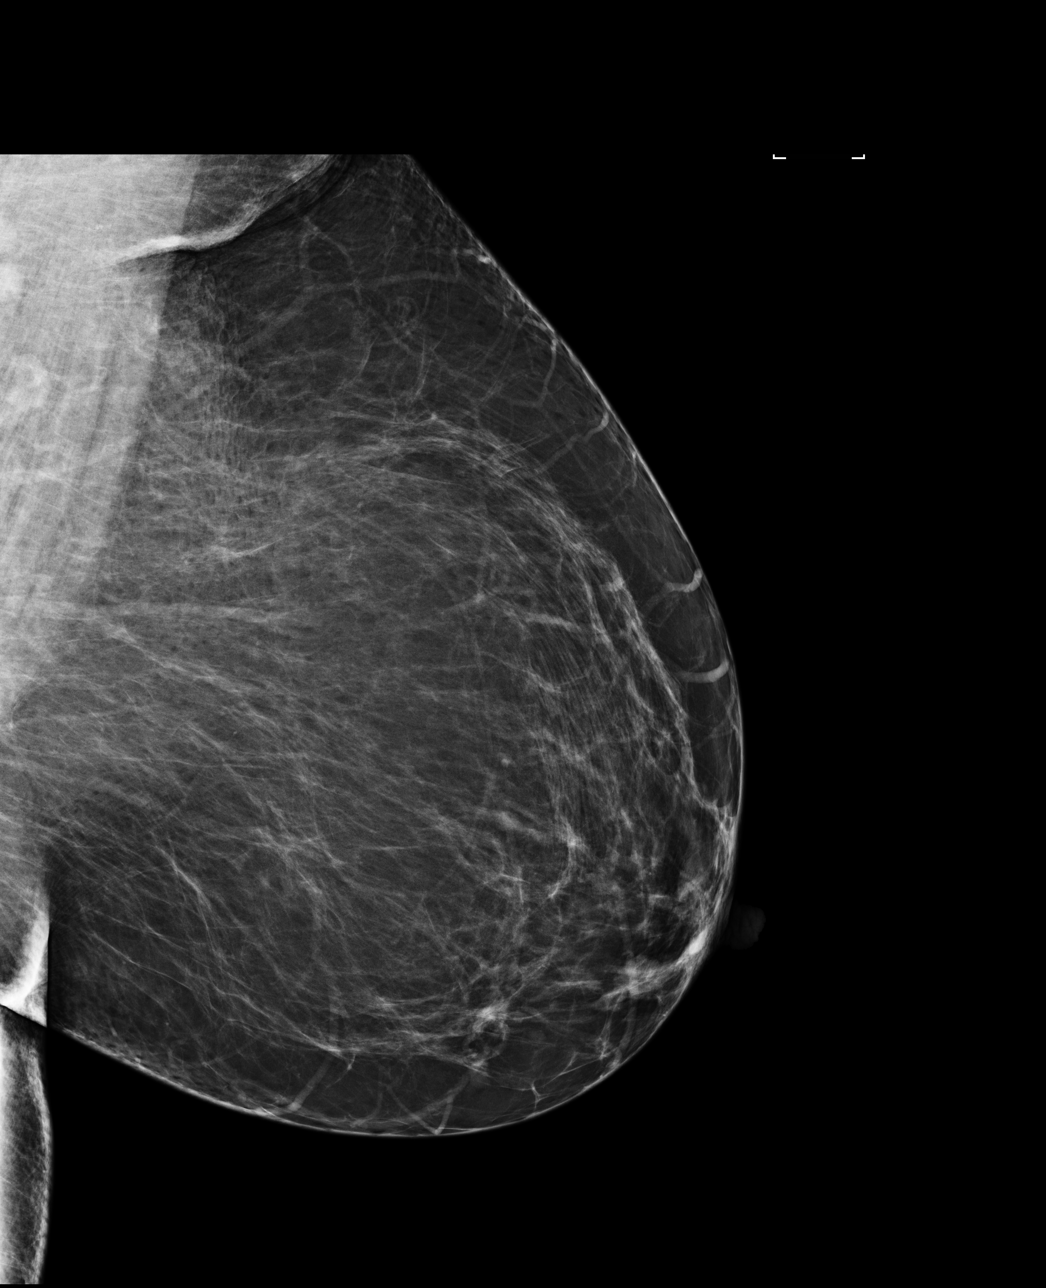

[L CC]
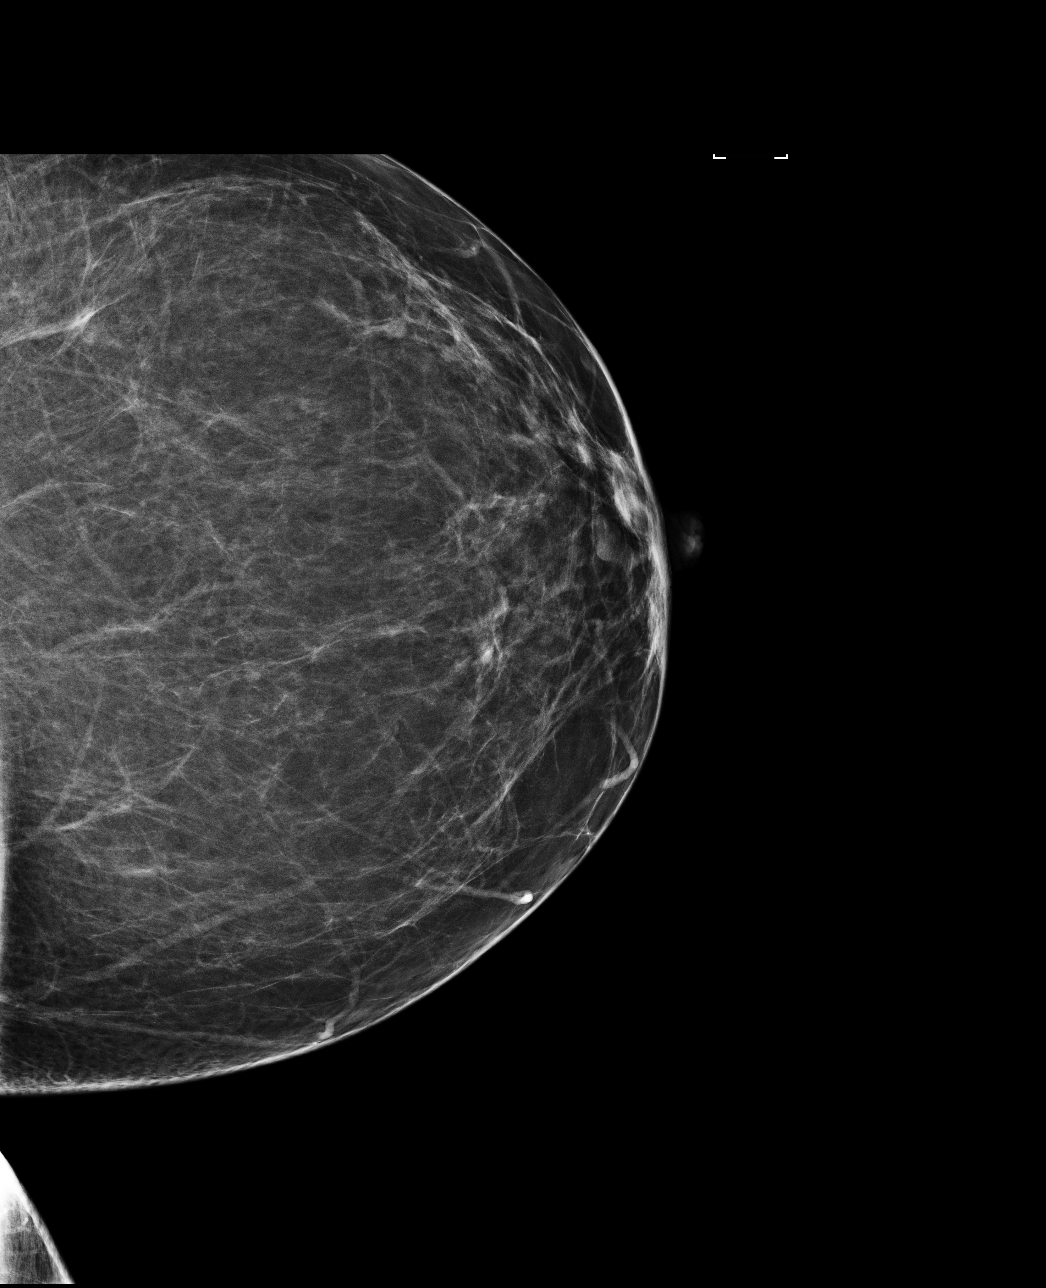

[R MLO]
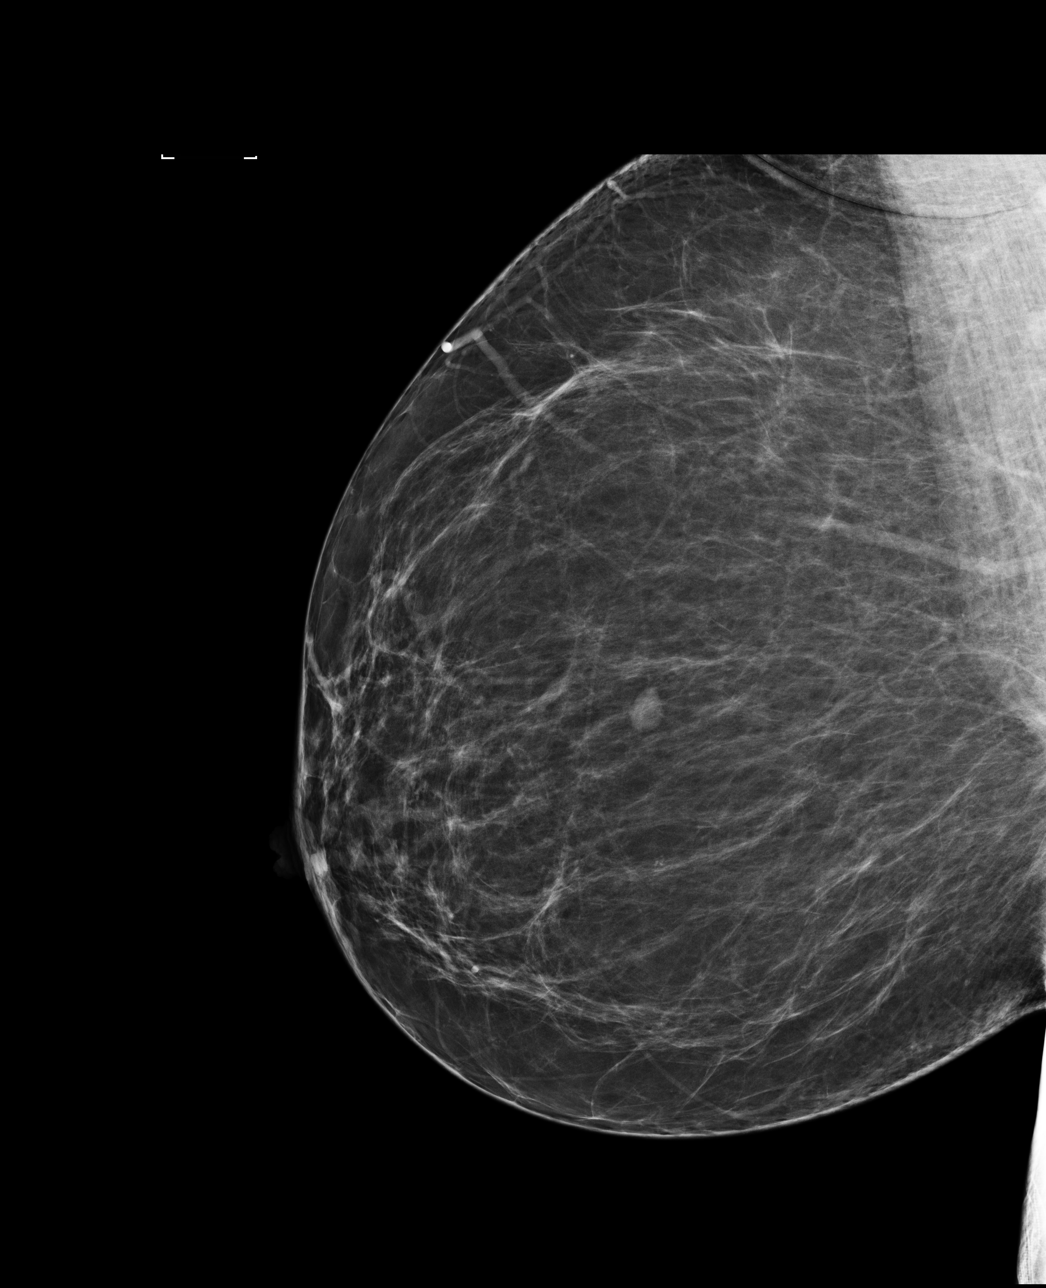

[R CC]
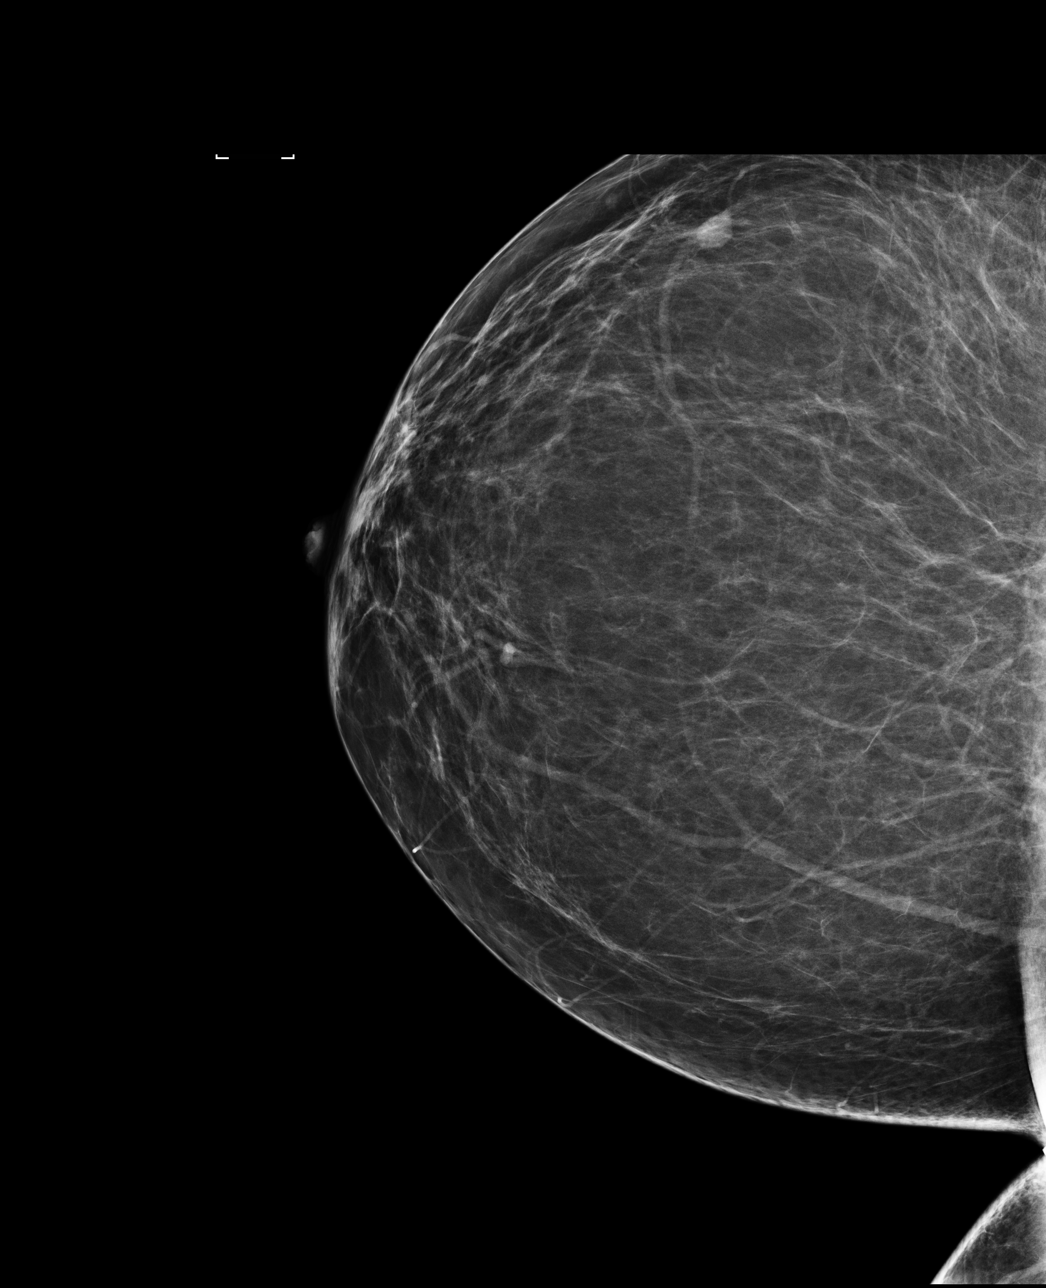

[4 of 4 positions shown; findings below may reference images not displayed]

ACR Breast Density Category b: There are scattered areas of
fibroglandular density.
FINDINGS: There are no findings suspicious for malignancy. Images were
processed with CAD.
IMPRESSION: No mammographic evidence of malignancy. A result letter of this
screening mammogram will be mailed directly to the patient.

RECOMMENDATION:
Screening mammogram in one year. (Code:AS-G-LCT)

BI-RADS CATEGORY  1: Negative.

## 2020-11-09 ENCOUNTER — Other Ambulatory Visit: Payer: Self-pay

## 2020-11-09 ENCOUNTER — Ambulatory Visit
Admission: RE | Admit: 2020-11-09 | Discharge: 2020-11-09 | Disposition: A | Discharge status: Home / Self Care | Payer: Self-pay | Source: Ambulatory Visit | Attending: Obstetrics and Gynecology | Admitting: Obstetrics and Gynecology

## 2020-11-09 ENCOUNTER — Other Ambulatory Visit: Payer: Self-pay | Admitting: Obstetrics and Gynecology

## 2020-11-09 DIAGNOSIS — N6310 Unspecified lump in the right breast, unspecified quadrant: Secondary | ICD-10-CM

## 2020-11-09 DIAGNOSIS — Z1231 Encounter for screening mammogram for malignant neoplasm of breast: Secondary | ICD-10-CM

## 2020-12-20 ENCOUNTER — Other Ambulatory Visit: Payer: Self-pay

## 2020-12-20 ENCOUNTER — Ambulatory Visit
Admission: RE | Admit: 2020-12-20 | Discharge: 2020-12-20 | Disposition: A | Discharge status: Home / Self Care | Payer: BC Managed Care – PPO | Source: Ambulatory Visit | Attending: Obstetrics and Gynecology | Admitting: Obstetrics and Gynecology

## 2020-12-20 ENCOUNTER — Other Ambulatory Visit: Payer: Self-pay | Admitting: Obstetrics and Gynecology

## 2020-12-20 DIAGNOSIS — N6320 Unspecified lump in the left breast, unspecified quadrant: Secondary | ICD-10-CM

## 2020-12-20 DIAGNOSIS — N6310 Unspecified lump in the right breast, unspecified quadrant: Secondary | ICD-10-CM

## 2020-12-20 DIAGNOSIS — N6489 Other specified disorders of breast: Secondary | ICD-10-CM

## 2021-01-20 ENCOUNTER — Encounter: Payer: Self-pay | Admitting: Family Medicine

## 2021-01-26 ENCOUNTER — Ambulatory Visit (INDEPENDENT_AMBULATORY_CARE_PROVIDER_SITE_OTHER): Payer: BC Managed Care – PPO | Admitting: Family Medicine

## 2021-01-26 ENCOUNTER — Encounter: Payer: Self-pay | Admitting: Family Medicine

## 2021-01-26 VITALS — BP 132/84 | HR 106 | Temp 98.2°F | Wt 216.0 lb

## 2021-01-26 DIAGNOSIS — Z0000 Encounter for general adult medical examination without abnormal findings: Secondary | ICD-10-CM | POA: Diagnosis not present

## 2021-01-26 DIAGNOSIS — R5383 Other fatigue: Secondary | ICD-10-CM | POA: Diagnosis not present

## 2021-01-26 DIAGNOSIS — I10 Essential (primary) hypertension: Secondary | ICD-10-CM

## 2021-01-26 DIAGNOSIS — R7303 Prediabetes: Secondary | ICD-10-CM

## 2021-01-26 DIAGNOSIS — E782 Mixed hyperlipidemia: Secondary | ICD-10-CM

## 2021-01-26 DIAGNOSIS — Z1211 Encounter for screening for malignant neoplasm of colon: Secondary | ICD-10-CM

## 2021-01-26 DIAGNOSIS — J302 Other seasonal allergic rhinitis: Secondary | ICD-10-CM

## 2021-01-26 DIAGNOSIS — Z1159 Encounter for screening for other viral diseases: Secondary | ICD-10-CM

## 2021-01-26 LAB — COMPREHENSIVE METABOLIC PANEL
ALT: 21 U/L (ref 0–35)
AST: 24 U/L (ref 0–37)
Albumin: 4.2 g/dL (ref 3.5–5.2)
Alkaline Phosphatase: 50 U/L (ref 39–117)
BUN: 9 mg/dL (ref 6–23)
CO2: 28 mEq/L (ref 19–32)
Calcium: 10 mg/dL (ref 8.4–10.5)
Chloride: 103 mEq/L (ref 96–112)
Creatinine, Ser: 0.68 mg/dL (ref 0.40–1.20)
GFR: 100.46 mL/min (ref >60.00)
Glucose, Bld: 75 mg/dL (ref 70–99)
Potassium: 3.8 mEq/L (ref 3.5–5.1)
Sodium: 137 mEq/L (ref 135–145)
Total Bilirubin: 0.5 mg/dL (ref 0.2–1.2)
Total Protein: 7.8 g/dL (ref 6.0–8.3)

## 2021-01-26 LAB — HEMOGLOBIN A1C: Hgb A1c MFr Bld: 6.1 % (ref 4.6–6.5)

## 2021-01-26 LAB — VITAMIN D 25 HYDROXY (VIT D DEFICIENCY, FRACTURES): VITD: 33.6 ng/mL (ref 30.00–100.00)

## 2021-01-26 LAB — LIPID PANEL
Cholesterol: 204 mg/dL — ABNORMAL HIGH (ref 0–200)
HDL: 64.1 mg/dL (ref >39.00)
LDL Cholesterol: 118 mg/dL — ABNORMAL HIGH (ref 0–99)
NonHDL: 139.48
Total CHOL/HDL Ratio: 3
Triglycerides: 105 mg/dL (ref 0.0–149.0)
VLDL: 21 mg/dL (ref 0.0–40.0)

## 2021-01-26 LAB — T4, FREE: Free T4: 0.71 ng/dL (ref 0.60–1.60)

## 2021-01-26 LAB — VITAMIN B12: Vitamin B-12: 886 pg/mL (ref 211–911)

## 2021-01-26 LAB — TSH: TSH: 0.56 u[IU]/mL (ref 0.35–5.50)

## 2021-01-26 MED ORDER — AMILORIDE-HYDROCHLOROTHIAZIDE 5-50 MG PO TABS: ORAL_TABLET | ORAL | 3 refills | Status: DC

## 2021-01-26 MED ORDER — FLUTICASONE PROPIONATE 50 MCG/ACT NA SUSP: NASAL | 11 refills | Status: DC

## 2021-01-26 NOTE — Progress Notes (Signed)
Subjective:     Danielle Day is a 50 y.o. female and is here for a comprehensive physical exam. The patient reports no problems.  Patient is not currently exercising.  States BP is good when she is at appointments, typically 120s/80s.  Pt notes feeling tired.  Inquires about starting OTC Vit D supplement.  Increased allergy symptoms.  Using flonase daily.  Alternating xyzal, claritin, and zyrtec for allergies.  Patient had recent follow-up with OB/GYN.  Needs to reschedule her colonoscopy.  States had an appointment with GI provider on [place].  Social History   Socioeconomic History   Marital status: Married    Spouse name: Not on file   Number of children: Not on file   Years of education: Not on file   Highest education level: Not on file  Occupational History   Not on file  Tobacco Use   Smoking status: Never   Smokeless tobacco: Never  Substance and Sexual Activity   Alcohol use: Not on file   Drug use: Not on file   Sexual activity: Not on file  Other Topics Concern   Not on file  Social History Narrative   Not on file   Social Determinants of Health   Financial Resource Strain: Not on file  Food Insecurity: Not on file  Transportation Needs: Not on file  Physical Activity: Not on file  Stress: Not on file  Social Connections: Not on file  Intimate Partner Violence: Not on file   Health Maintenance  Topic Date Due   HIV Screening  Never done   Hepatitis C Screening  Never done   COLONOSCOPY (Pts 45-28yrs Insurance coverage will need to be confirmed)  Never done   Zoster Vaccines- Shingrix (1 of 2) Never done   COVID-19 Vaccine (2 - Pfizer series) 09/19/2019   PAP SMEAR-Modifier  10/23/2020   INFLUENZA VACCINE  05/05/2021 (Originally 09/05/2020)   MAMMOGRAM  12/21/2022   TETANUS/TDAP  11/16/2026   Pneumococcal Vaccine 30-66 Years old  Aged Out   HPV VACCINES  Aged Out    The following portions of the patient's history were reviewed and updated as  appropriate: allergies, current medications, past family history, past medical history, past social history, past surgical history, and problem list.  Review of Systems Pertinent items noted in HPI and remainder of comprehensive ROS otherwise negative.   Objective:    BP 132/84 (BP Location: Left Arm, Patient Position: Sitting, Cuff Size: Large)    Pulse (!) 106    Temp 98.2 F (36.8 C) (Oral)    Wt 216 lb (98 kg)    LMP 06/22/2011 Comment: Postmenopausal   SpO2 99%    BMI 37.08 kg/m  General appearance: alert, cooperative, and no distress Head: Normocephalic, without obvious abnormality, atraumatic Eyes: conjunctivae/corneas clear. PERRL, EOM's intact. Fundi benign. Ears: normal TM's and external ear canals both ears Nose: Nares normal. Septum midline. Mucosa normal. No drainage or sinus tenderness. Throat: lips, mucosa, and tongue normal; teeth and gums normal Neck: no adenopathy, no carotid bruit, no JVD, supple, symmetrical, trachea midline, and thyroid not enlarged, symmetric, no tenderness/mass/nodules Lungs: clear to auscultation bilaterally Heart: regular rate and rhythm, S1, S2 normal, no murmur, click, rub or gallop Abdomen: soft, non-tender; bowel sounds normal; no masses,  no organomegaly Extremities: extremities normal, atraumatic, no cyanosis or edema Pulses: 2+ and symmetric Skin: Skin color, texture, turgor normal. No rashes or lesions Lymph nodes: Cervical, supraclavicular, and axillary nodes normal. Neurologic: Alert and oriented X 3,  normal strength and tone. Normal symmetric reflexes. Normal coordination and gait    Assessment:    Healthy female exam.      Plan:    Anticipatory guidance given including wearing seatbelts, smoke detectors in the home, increasing physical activity, increasing p.o. intake of water and vegetables. -labs -Continue follow-up with OB/GYN -Immunizations reviewed.  Patient declines influenza vaccine at this time. -Discussed colonoscopy.   Referral placed. -Mammogram up-to-date.  Done 12/20/2020 -Next CPE in 1 year See After Visit Summary for Counseling Recommendations  Prediabetes -Hemoglobin A1c 6.1% on 01/15/2019 -Lifestyle modifications encouraged - Plan: Hemoglobin A1c  Mixed hyperlipidemia -Lifestyle modification -Consider starting a statin based on labs this visit - Plan: Lipid panel  Fatigue, unspecified type -Likely multifactorial.  Consider vitamin deficiency, obesity, medication -We will obtain labs -Patient encouraged to start exercising regularly - Plan: T4, Free, Vitamin D, 25-hydroxy, Vitamin B12, TSH  Essential hypertension -Elevated -Recheck -Continue one half tab of amiloride-HCTZ 5-50 mg daily -Lifestyle modifications - Plan: CMP, amiloride-hydrochlorothiazide (MODURETIC) 5-50 MG tablet  Morbid obesity (HCC) -Lifestyle modification strongly encouraged  Seasonal allergies  -continue alternating OTC antihistamine and Flonase daily -Consider restarting Singulair for worsening symptoms. - Plan: fluticasone (FLONASE) 50 MCG/ACT nasal spray  Encounter for hepatitis C screening test for low risk patient  - Plan: Hep C Antibody  Colon cancer screening  - Plan: Ambulatory referral to Gastroenterology  Follow-up in 3-4 months for BP recheck  Abbe Amsterdam, MD

## 2021-01-27 ENCOUNTER — Encounter: Payer: Self-pay | Admitting: Family Medicine

## 2021-01-31 NOTE — Progress Notes (Addendum)
ACUTE VISIT Chief Complaint  Patient presents with   Blood Pressure Check   HPI: Danielle Day is a 50 y.o. female with hx of allergies,HTN,and hyperlipidemia here today complaining of elevated BP readings at home: 127/91,137/?,150/90. HTN, she is on Amiloride-HCTZ 5-50 mg 1/2 tab daily, which she has been taking for years. Negative for new medications,OTC herb products or supplements, or dietary changes. She has had a few home stressors for the past year.  Negative for visual changes, chest pain, dyspnea, palpitation,focal weakness, or edema.  Lab Results  Component Value Date   CREATININE 0.68 01/26/2021   BUN 9 01/26/2021   NA 137 01/26/2021   K 3.8 01/26/2021   CL 103 01/26/2021   CO2 28 01/26/2021   "Tightness" headache, "woozy" sensation. Occipital or retro ocular headache, not new, reporting hx of headaches, which she attributes to allergies. Intermittent, 3-4/10. No associated photophobia, nausea, vomiting, or visual aura. She usually takes ibuprofen.  Noted mild tachycardia today, states that she "always" has elevated HR. Lab Results  Component Value Date   TSH 0.56 01/26/2021   Review of Systems  Constitutional:  Positive for fatigue. Negative for activity change, appetite change and fever.  HENT:  Negative for mouth sores, nosebleeds, sinus pain and sore throat.   Respiratory:  Negative for cough and wheezing.   Gastrointestinal:  Negative for abdominal pain.       Negative for changes in bowel habits.  Endocrine: Negative for cold intolerance and heat intolerance.  Genitourinary:  Negative for decreased urine volume and hematuria.  Musculoskeletal:  Negative for gait problem and myalgias.  Skin:  Negative for pallor and rash.  Allergic/Immunologic: Positive for environmental allergies.  Neurological:  Negative for syncope, facial asymmetry and numbness.  Rest see pertinent positives and negatives per HPI.  Current Outpatient Medications on File  Prior to Visit  Medication Sig Dispense Refill   amiloride-hydrochlorothiazide (MODURETIC) 5-50 MG tablet 1/2 tab by mouth daily 45 tablet 3   fluticasone (FLONASE) 50 MCG/ACT nasal spray SHAKE LIQUID AND USE 2 SPRAYS IN EACH NOSTRIL DAILY 16 g 11   levocetirizine (XYZAL) 5 MG tablet Take 1 tablet (5 mg total) by mouth every evening. 30 tablet 5   montelukast (SINGULAIR) 10 MG tablet Take 1 tablet (10 mg total) by mouth at bedtime. 30 tablet 3   Multiple Vitamin (MULTIVITAMIN) tablet Take 1 tablet by mouth daily.     No current facility-administered medications on file prior to visit.   Past Medical History:  Diagnosis Date   Essential hypertension 10/02/2006   Qualifier: Diagnosis of  By: Everardo All MD, Cleophas Dunker    GOITER, MULTINODULAR 09/11/2007   Qualifier: Diagnosis of  By: Everardo All MD, Cleophas Dunker    HYPERGLYCEMIA 09/11/2007   Qualifier: Diagnosis of  By: Everardo All MD, Sean A    Hyperthyroidism 10/02/2006   Qualifier: Diagnosis of  By: Everardo All MD, Sean A    Allergies  Allergen Reactions   Aspirin Palpitations   Caffeine Palpitations    Social History   Socioeconomic History   Marital status: Married    Spouse name: Not on file   Number of children: Not on file   Years of education: Not on file   Highest education level: Not on file  Occupational History   Not on file  Tobacco Use   Smoking status: Never   Smokeless tobacco: Never  Substance and Sexual Activity   Alcohol use: Not on file   Drug use: Not on file  Sexual activity: Not on file  Other Topics Concern   Not on file  Social History Narrative   Not on file   Social Determinants of Health   Financial Resource Strain: Not on file  Food Insecurity: Not on file  Transportation Needs: Not on file  Physical Activity: Not on file  Stress: Not on file  Social Connections: Not on file    Vitals:   02/01/21 1137  BP: (!) 140/100  Pulse: (!) 104  Resp: 16   Body mass index is 37.08 kg/m.  Physical Exam Vitals and  nursing note reviewed.  Constitutional:      General: She is not in acute distress.    Appearance: She is well-developed.  HENT:     Head: Normocephalic and atraumatic.     Mouth/Throat:     Mouth: Mucous membranes are dry.     Pharynx: Oropharynx is clear.  Eyes:     Conjunctiva/sclera: Conjunctivae normal.  Cardiovascular:     Rate and Rhythm: Regular rhythm. Tachycardia present.     Pulses:          Dorsalis pedis pulses are 2+ on the right side and 2+ on the left side.     Heart sounds: No murmur heard. Pulmonary:     Effort: Pulmonary effort is normal. No respiratory distress.     Breath sounds: Normal breath sounds.  Abdominal:     Palpations: Abdomen is soft. There is no hepatomegaly or mass.     Tenderness: There is no abdominal tenderness.  Musculoskeletal:     Cervical back: No tenderness or bony tenderness.  Lymphadenopathy:     Cervical: No cervical adenopathy.  Skin:    General: Skin is warm.     Findings: No erythema or rash.  Neurological:     General: No focal deficit present.     Mental Status: She is alert and oriented to person, place, and time.     Cranial Nerves: No cranial nerve deficit.     Gait: Gait normal.  Psychiatric:     Comments: Well groomed, good eye contact.     ASSESSMENT AND PLAN:  Ms.Danielle Day was seen today for blood pressure check.  Diagnoses and all orders for this visit: Orders Placed This Encounter  Procedures   EKG 12-Lead   Headache, unspecified headache type We discussed possible etiologies, problem seems to be unchanged from her baseline. Allergies can certainly be a contributing factor. ?  Tension headache.  I do not think further work-up is needed at this time. Instructed about warning signs.  Sinus tachycardia It seems to be chronic. It is mild and asymptomatic. EKG done today showed mild sinus tachycardia, normal axis and intervals, LAE. No T-ST abnormalities. No other EKG available for comparison. Recommend  Metoprolol succinate 25 mg daily, we discussed some side effects. Instructed to monitor HR at home.  Essential hypertension Rechecked: 140s/100. We reviewed the appropriate technique to check BP at home. We discussed all other pharmacologic options, because of sinus tachycardia, I think she would benefit from beta-blocker. Recommend metoprolol succinate 25 mg at bedtime. No changes in amiloride-HCTZ dose. We discussed the importance of following low-salt/DASH diet. Continue monitoring BP at home. Instructed about warning signs. Follow-up in 4 weeks with PCP, before if needed.  Return in about 4 weeks (around 03/01/2021) for HTN with PCP.  Zori Benbrook G. Martin[place], MD  [facility]. [place] office.

## 2021-01-31 NOTE — Telephone Encounter (Signed)
Spoke with pt, states she is feeling better. Still concerned about BP, scheduled her with Dr [place] on 12/28.

## 2021-02-01 ENCOUNTER — Other Ambulatory Visit: Payer: Self-pay | Admitting: Family Medicine

## 2021-02-01 ENCOUNTER — Ambulatory Visit (INDEPENDENT_AMBULATORY_CARE_PROVIDER_SITE_OTHER): Payer: BC Managed Care – PPO | Admitting: Family Medicine

## 2021-02-01 ENCOUNTER — Encounter: Payer: Self-pay | Admitting: Family Medicine

## 2021-02-01 VITALS — BP 140/100 | HR 104 | Resp 16 | Ht 64.0 in | Wt 216.0 lb

## 2021-02-01 DIAGNOSIS — R000 Tachycardia, unspecified: Secondary | ICD-10-CM | POA: Diagnosis not present

## 2021-02-01 DIAGNOSIS — I10 Essential (primary) hypertension: Secondary | ICD-10-CM

## 2021-02-01 DIAGNOSIS — R519 Headache, unspecified: Secondary | ICD-10-CM | POA: Diagnosis not present

## 2021-02-01 MED ORDER — METOPROLOL SUCCINATE ER 25 MG PO TB24
25.0000 mg | ORAL_TABLET | Freq: Every day | ORAL | 1 refills | Status: DC
Start: 2021-02-01 — End: 2021-02-01

## 2021-02-01 NOTE — Assessment & Plan Note (Signed)
Rechecked: 140s/100. We reviewed the appropriate technique to check BP at home. We discussed all other pharmacologic options, because of sinus tachycardia, I think she would benefit from beta-blocker. Recommend metoprolol succinate 25 mg at bedtime. No changes in amiloride-HCTZ dose. We discussed the importance of following low-salt/DASH diet. Continue monitoring BP at home. Instructed about warning signs. Follow-up in 4 weeks with PCP, before if needed.

## 2021-02-01 NOTE — Assessment & Plan Note (Addendum)
It seems to be chronic. It is mild and asymptomatic. EKG done today showed mild sinus tachycardia, normal axis and intervals, LAE. No T-ST abnormalities. No other EKG available for comparison. Recommend Metoprolol succinate 25 mg daily, we discussed some side effects. Instructed to monitor HR at home.

## 2021-02-01 NOTE — Patient Instructions (Addendum)
A few things to remember from today's visit:  Headache, unspecified headache type  Essential hypertension - Plan: EKG 12-Lead  Sinus tachycardia - Plan: EKG 12-Lead  If you need refills please call your pharmacy. Do not use My Chart to request refills or for acute issues that need immediate attention.   Tylenol is preferable than Ibuprofen for headache, 1 tab daily if needed. Today we added Metoprolol to take at bedtime. This medication will also help with elevated heart rate. Monitor blood pressure and heart rate. No changes in Amiloride-HCTZ dose. Adequate hydration.  Please be sure medication list is accurate. If a new problem present, please set up appointment sooner than planned today. DASH Eating Plan DASH stands for Dietary Approaches to Stop Hypertension. The DASH eating plan is a healthy eating plan that has been shown to: Reduce high blood pressure (hypertension). Reduce your risk for type 2 diabetes, heart disease, and stroke. Help with weight loss. What are tips for following this plan? Reading food labels Check food labels for the amount of salt (sodium) per serving. Choose foods with less than 5 percent of the Daily Value of sodium. Generally, foods with less than 300 milligrams (mg) of sodium per serving fit into this eating plan. To find whole grains, look for the word "whole" as the first word in the ingredient list. Shopping Buy products labeled as "low-sodium" or "no salt added." Buy fresh foods. Avoid canned foods and pre-made or frozen meals. Cooking Avoid adding salt when cooking. Use salt-free seasonings or herbs instead of table salt or sea salt. Check with your health care provider or pharmacist before using salt substitutes. Do not fry foods. Cook foods using healthy methods such as baking, boiling, grilling, roasting, and broiling instead. Cook with heart-healthy oils, such as olive, canola, avocado, soybean, or sunflower oil. Meal planning  Eat a  balanced diet that includes: 4 or more servings of fruits and 4 or more servings of vegetables each day. Try to fill one-half of your plate with fruits and vegetables. 6-8 servings of whole grains each day. Less than 6 oz (170 g) of lean meat, poultry, or fish each day. A 3-oz (85-g) serving of meat is about the same size as a deck of cards. One egg equals 1 oz (28 g). 2-3 servings of low-fat dairy each day. One serving is 1 cup (237 mL). 1 serving of nuts, seeds, or beans 5 times each week. 2-3 servings of heart-healthy fats. Healthy fats called omega-3 fatty acids are found in foods such as walnuts, flaxseeds, fortified milks, and eggs. These fats are also found in cold-water fish, such as sardines, salmon, and mackerel. Limit how much you eat of: Canned or prepackaged foods. Food that is high in trans fat, such as some fried foods. Food that is high in saturated fat, such as fatty meat. Desserts and other sweets, sugary drinks, and other foods with added sugar. Full-fat dairy products. Do not salt foods before eating. Do not eat more than 4 egg yolks a week. Try to eat at least 2 vegetarian meals a week. Eat more home-cooked food and less restaurant, buffet, and fast food. Lifestyle When eating at a restaurant, ask that your food be prepared with less salt or no salt, if possible. If you drink alcohol: Limit how much you use to: 0-1 drink a day for women who are not pregnant. 0-2 drinks a day for men. Be aware of how much alcohol is in your drink. In the [place] one drink  equals one 12 oz bottle of beer (355 mL), one 5 oz glass of wine (148 mL), or one 1 oz glass of hard liquor (44 mL). General information Avoid eating more than 2,300 mg of salt a day. If you have hypertension, you may need to reduce your sodium intake to 1,500 mg a day. Work with your health care provider to maintain a healthy body weight or to lose weight. Ask what an ideal weight is for you. Get at least 30 minutes of  exercise that causes your heart to beat faster (aerobic exercise) most days of the week. Activities may include walking, swimming, or biking. Work with your health care provider or dietitian to adjust your eating plan to your individual calorie needs. What foods should I eat? Fruits All fresh, dried, or frozen fruit. Canned fruit in natural juice (without added sugar). Vegetables Fresh or frozen vegetables (raw, steamed, roasted, or grilled). Low-sodium or reduced-sodium tomato and vegetable juice. Low-sodium or reduced-sodium tomato sauce and tomato paste. Low-sodium or reduced-sodium canned vegetables. Grains Whole-grain or whole-wheat bread. Whole-grain or whole-wheat pasta. Brown rice. Orpah Cobb. Bulgur. Whole-grain and low-sodium cereals. Pita bread. Low-fat, low-sodium crackers. Whole-wheat flour tortillas. Meats and other proteins Skinless chicken or [place]. Ground chicken or [place]. Pork with fat trimmed off. Fish and seafood. Egg whites. Dried beans, peas, or lentils. Unsalted nuts, nut butters, and seeds. Unsalted canned beans. Lean cuts of beef with fat trimmed off. Low-sodium, lean precooked or cured meat, such as sausages or meat loaves. Dairy Low-fat (1%) or fat-free (skim) milk. Reduced-fat, low-fat, or fat-free cheeses. Nonfat, low-sodium ricotta or cottage cheese. Low-fat or nonfat yogurt. Low-fat, low-sodium cheese. Fats and oils Soft margarine without trans fats. Vegetable oil. Reduced-fat, low-fat, or light mayonnaise and salad dressings (reduced-sodium). Canola, safflower, olive, avocado, soybean, and sunflower oils. Avocado. Seasonings and condiments Herbs. Spices. Seasoning mixes without salt. Other foods Unsalted popcorn and pretzels. Fat-free sweets. The items listed above may not be a complete list of foods and beverages you can eat. Contact a dietitian for more information. What foods should I avoid? Fruits Canned fruit in a light or heavy syrup. Fried fruit.  Fruit in cream or butter sauce. Vegetables Creamed or fried vegetables. Vegetables in a cheese sauce. Regular canned vegetables (not low-sodium or reduced-sodium). Regular canned tomato sauce and paste (not low-sodium or reduced-sodium). Regular tomato and vegetable juice (not low-sodium or reduced-sodium). Rosita Fire. Olives. Grains Baked goods made with fat, such as croissants, muffins, or some breads. Dry pasta or rice meal packs. Meats and other proteins Fatty cuts of meat. Ribs. Fried meat. Tomasa Blase. Bologna, salami, and other precooked or cured meats, such as sausages or meat loaves. Fat from the back of a pig (fatback). Bratwurst. Salted nuts and seeds. Canned beans with added salt. Canned or smoked fish. Whole eggs or egg yolks. Chicken or [place] with skin. Dairy Whole or 2% milk, cream, and half-and-half. Whole or full-fat cream cheese. Whole-fat or sweetened yogurt. Full-fat cheese. Nondairy creamers. Whipped toppings. Processed cheese and cheese spreads. Fats and oils Butter. Stick margarine. Lard. Shortening. Ghee. Bacon fat. Tropical oils, such as coconut, palm kernel, or palm oil. Seasonings and condiments Onion salt, garlic salt, seasoned salt, table salt, and sea salt. Worcestershire sauce. Tartar sauce. Barbecue sauce. Teriyaki sauce. Soy sauce, including reduced-sodium. Steak sauce. Canned and packaged gravies. Fish sauce. Oyster sauce. Cocktail sauce. Store-bought horseradish. Ketchup. Mustard. Meat flavorings and tenderizers. Bouillon cubes. Hot sauces. Pre-made or packaged marinades. Pre-made or packaged taco seasonings. Relishes. Regular  salad dressings. Other foods Salted popcorn and pretzels. The items listed above may not be a complete list of foods and beverages you should avoid. Contact a dietitian for more information. Where to find more information National Heart, Lung, and Blood Institute: PopSteam.is American Heart Association: www.heart.org Academy of Nutrition and  Dietetics: www.eatright.org National Kidney Foundation: www.kidney.org Summary The DASH eating plan is a healthy eating plan that has been shown to reduce high blood pressure (hypertension). It may also reduce your risk for type 2 diabetes, heart disease, and stroke. When on the DASH eating plan, aim to eat more fresh fruits and vegetables, whole grains, lean proteins, low-fat dairy, and heart-healthy fats. With the DASH eating plan, you should limit salt (sodium) intake to 2,300 mg a day. If you have hypertension, you may need to reduce your sodium intake to 1,500 mg a day. Work with your health care provider or dietitian to adjust your eating plan to your individual calorie needs. This information is not intended to replace advice given to you by your health care provider. Make sure you discuss any questions you have with your health care provider. Document Revised: 12/26/2018 Document Reviewed: 12/26/2018 Elsevier Patient Education  2022 [employer].

## 2021-03-01 ENCOUNTER — Ambulatory Visit: Payer: BC Managed Care – PPO | Admitting: Family Medicine

## 2021-03-01 ENCOUNTER — Encounter: Payer: Self-pay | Admitting: Family Medicine

## 2021-03-01 VITALS — BP 118/78 | HR 102 | Temp 98.2°F | Resp 18 | Ht 64.0 in | Wt 216.4 lb

## 2021-03-01 DIAGNOSIS — I10 Essential (primary) hypertension: Secondary | ICD-10-CM | POA: Diagnosis not present

## 2021-03-01 MED ORDER — METOPROLOL SUCCINATE ER 25 MG PO TB24: ORAL_TABLET | ORAL | 2 refills | Status: DC

## 2021-03-01 NOTE — Progress Notes (Signed)
Subjective:    Patient ID: Danielle Day, female    DOB: 1970/09/23, 51 y.o.   MRN: [id number]  Chief Complaint  Patient presents with   Hypertension    Doing well , no outside readings , no dizziness or headaches    HPI Patient was seen today for f/u on HTN.  Pt seen in  clinic on 12/28 for elevated bp.  Metoprolol succinate 25 mg added to regimen of amiloride-hydrochlorothiazide 5-50 mg half tab daily.  Since the additional medication patient notes BP has been good, 125/83 at the highest.  Patient is feeling better.  Not currently having headaches associated with blood pressure.  Notes some increased stress with her teenage daughter.  Patient eating out less and being mindful of salt by reading labels.  Trying to lose weight, but notes difficulty throughout the years.  Past Medical History:  Diagnosis Date   Essential hypertension 10/02/2006   Qualifier: Diagnosis of  By: Loanne Drilling MD, Jacelyn Pi    GOITER, MULTINODULAR 09/11/2007   Qualifier: Diagnosis of  By: Loanne Drilling MD, Jacelyn Pi    HYPERGLYCEMIA 09/11/2007   Qualifier: Diagnosis of  By: Loanne Drilling MD, Sean A    Hyperthyroidism 10/02/2006   Qualifier: Diagnosis of  By: Loanne Drilling MD, Sean A     Allergies  Allergen Reactions   Aspirin Palpitations   Caffeine Palpitations    ROS General: Denies fever, chills, night sweats, changes in weight, changes in appetite HEENT: Denies headaches, ear pain, changes in vision, rhinorrhea, sore throat CV: Denies CP, palpitations, SOB, orthopnea Pulm: Denies SOB, cough, wheezing GI: Denies abdominal pain, nausea, vomiting, diarrhea, constipation GU: Denies dysuria, hematuria, frequency, vaginal discharge Msk: Denies muscle cramps, joint pains Neuro: Denies weakness, numbness, tingling Skin: Denies rashes, bruising Psych: Denies depression, anxiety, hallucinations     Objective:    Blood pressure 118/78, pulse (!) 102, temperature 98.2 F (36.8 C), resp. rate 18, height 5\' 4"  (1.626 m), weight 216 lb  6.4 oz (98.2 kg), last menstrual period 06/22/2011, SpO2 98 %.  Gen. Pleasant, well-nourished, in no distress, normal affect   HEENT: [place]/AT, face symmetric, conjunctiva clear, no scleral icterus, PERRLA, EOMI, nares patent without drainage Lungs: no accessory muscle use Cardiovascular: RRR, no peripheral edema Neuro:  A&Ox3, CN II-XII intact, normal gait Skin:  Warm, no lesions/ rash   Wt Readings from Last 3 Encounters:  03/01/21 216 lb 6.4 oz (98.2 kg)  02/01/21 216 lb (98 kg)  01/26/21 216 lb (98 kg)    Lab Results  Component Value Date   WBC 3.7 (L) 01/25/2020   HGB 14.2 01/25/2020   HCT 40.2 01/25/2020   PLT 222 01/25/2020   GLUCOSE 75 01/26/2021   CHOL 204 (H) 01/26/2021   TRIG 105.0 01/26/2021   HDL 64.10 01/26/2021   LDLCALC 118 (H) 01/26/2021   ALT 21 01/26/2021   AST 24 01/26/2021   NA 137 01/26/2021   K 3.8 01/26/2021   CL 103 01/26/2021   CREATININE 0.68 01/26/2021   BUN 9 01/26/2021   CO2 28 01/26/2021   TSH 0.56 01/26/2021   HGBA1C 6.1 01/26/2021    Assessment/Plan:  Essential hypertension -Controlled -Continue Toprol-XL 25 mg daily and amiloride-hydrochlorothiazide 5-50 mg half tab daily -Continue lifestyle modifications -Continue checking BP at home and keeping a log to bring with you to clinic  F/u as needed in the next 2-3 months  Grier Mitts, MD

## 2021-03-15 ENCOUNTER — Other Ambulatory Visit: Payer: Self-pay | Admitting: Family Medicine

## 2021-03-15 DIAGNOSIS — I10 Essential (primary) hypertension: Secondary | ICD-10-CM

## 2021-03-31 ENCOUNTER — Other Ambulatory Visit: Payer: Self-pay | Admitting: Family Medicine

## 2021-04-12 ENCOUNTER — Ambulatory Visit (INDEPENDENT_AMBULATORY_CARE_PROVIDER_SITE_OTHER): Payer: BC Managed Care – PPO | Admitting: Family Medicine

## 2021-04-12 ENCOUNTER — Encounter: Payer: Self-pay | Admitting: Family Medicine

## 2021-04-12 VITALS — BP 123/79 | HR 116 | Temp 98.1°F | Wt 214.4 lb

## 2021-04-12 DIAGNOSIS — S161XXA Strain of muscle, fascia and tendon at neck level, initial encounter: Secondary | ICD-10-CM | POA: Diagnosis not present

## 2021-04-12 DIAGNOSIS — W19XXXA Unspecified fall, initial encounter: Secondary | ICD-10-CM

## 2021-04-12 DIAGNOSIS — M5412 Radiculopathy, cervical region: Secondary | ICD-10-CM

## 2021-04-12 MED ORDER — CYCLOBENZAPRINE HCL 5 MG PO TABS: 5.0000 mg | ORAL_TABLET | Freq: Two times a day (BID) | ORAL | 0 refills | Status: DC | PRN

## 2021-04-12 NOTE — Progress Notes (Signed)
Subjective:  ? ? Patient ID: Danielle Day, female    DOB: November 17, 1970, 51 y.o.   MRN: [id number] ? ?Chief Complaint  ?Patient presents with  ? Fall  ?  2/15 fell on rt knee, landed on left shoulder. Few days after she started having the pins and needles sensation, felt like a frog/ knot on shoulder. Has been using ice and heat, but only helps for a short time, ice helped with tingling feeling. Has some discomfort in back on neck to left shoulder. Took some tylenol, and it did not help, aleve releived it for a little bit, but has not taken anything since Friday.  ? ? ?HPI ?Patient was seen today for acute concern.  Patient endorses slipping on a piece of landscaping material then falling while walking to her car on 03/22/2021.  Patient states she landed on her right knee and left elbow.  3-4 days after incident patient developed a pulling sensation in left anterior shoulder/upper chest into left neck.  Also had pins-and-needles sensation/burning in left lateral neck.  Left side pain has since resolved.  Patient having difficulty sleeping on left side 2/2 the discomfort.  Also endorses left-sided cramp in muscles of neck.  Tried ice which helped, heat, and ibuprofen.  Denies LOC at time of incident, weakness in LUE, HA, numbness/tingling in left arm or hand. ? ?Since last OFV patient states she has been doing better overall.  In counseling which is helping.  Patient will be graduating high school in the next few months. ? ?Past Medical History:  ?Diagnosis Date  ? Essential hypertension 10/02/2006  ? Qualifier: Diagnosis of  By: Loanne Drilling MD, Jacelyn Pi   ? GOITER, MULTINODULAR 09/11/2007  ? Qualifier: Diagnosis of  By: Loanne Drilling MD, Jacelyn Pi   ? HYPERGLYCEMIA 09/11/2007  ? Qualifier: Diagnosis of  By: Loanne Drilling MD, Jacelyn Pi   ? Hyperthyroidism 10/02/2006  ? Qualifier: Diagnosis of  By: Loanne Drilling MD, Hilliard Clark A   ? ? ?Allergies  ?Allergen Reactions  ? Aspirin Palpitations  ? Caffeine Palpitations  ? ?ROS ?General: Denies fever, chills, night  sweats, changes in weight, changes in appetite ?HEENT: Denies headaches, ear pain, changes in vision, rhinorrhea, sore throat ?CV: Denies CP, palpitations, SOB, orthopnea ?Pulm: Denies SOB, cough, wheezing ?GI: Denies abdominal pain, nausea, vomiting, diarrhea, constipation ?GU: Denies dysuria, hematuria, frequency, vaginal discharge ?Msk: Denies muscle cramps, joint pains  + left shoulder pain ?Neuro: Denies weakness + numbness, tingling in left neck/proximal left shoulder ?Skin: Denies rashes, bruising ?Psych: Denies depression, anxiety, hallucinations ? ?   ?Objective:  ?  ?Blood pressure 123/79, pulse (!) 116, temperature 98.1 ?F (36.7 ?C), temperature source Oral, weight 214 lb 6.4 oz (97.3 kg), last menstrual period 06/22/2011, SpO2 98 %. ? ? ?Gen. Pleasant, well-nourished, in no distress, normal affect   ?HEENT: [place]/AT, face symmetric, conjunctiva clear, no scleral icterus, PERRLA, EOMI, nares patent without drainage, pharynx without erythema or exudate. ?Neck: No JVD, no thyromegaly, no carotid bruits ?Lungs: no accessory muscle use, CTAB, no wheezes or rales ?Cardiovascular: RRR, no m/r/g, no peripheral edema ?Musculoskeletal: TTP of cervical spine midline.  No TTP of thoracic, lumbar, paraspinal muscles.  Right posterior trapezius tight compared to left.  No TTP of left clavicle, left shoulder or left rotator cuff.  F ROM of left UE.  Discomfort with turning head to left and against resistance.  Mild discomfort with L axial loading  No deformities, no cyanosis or clubbing, normal tone ?Neuro:  A&Ox3, CN II-XII intact, normal  gait ?Skin:  Warm, no lesions/ rash ? ? ?Wt Readings from Last 3 Encounters:  ?04/12/21 214 lb 6.4 oz (97.3 kg)  ?03/01/21 216 lb 6.4 oz (98.2 kg)  ?02/01/21 216 lb (98 kg)  ? ? ?Lab Results  ?Component Value Date  ? WBC 3.7 (L) 01/25/2020  ? HGB 14.2 01/25/2020  ? HCT 40.2 01/25/2020  ? PLT 222 01/25/2020  ? GLUCOSE 75 01/26/2021  ? CHOL 204 (H) 01/26/2021  ? TRIG 105.0 01/26/2021  ?  HDL 64.10 01/26/2021  ? LDLCALC 118 (H) 01/26/2021  ? ALT 21 01/26/2021  ? AST 24 01/26/2021  ? NA 137 01/26/2021  ? K 3.8 01/26/2021  ? CL 103 01/26/2021  ? CREATININE 0.68 01/26/2021  ? BUN 9 01/26/2021  ? CO2 28 01/26/2021  ? TSH 0.56 01/26/2021  ? HGBA1C 6.1 01/26/2021  ? ? ?Assessment/Plan: ? ?Cervical radiculopathy  ?-Likely 2/2 muscle spasms compressing nerve.  Also consider bone spur on herniated disc. ?-Discussed treating musculoskeletal symptoms with heat, ice, topical analgesics, Tylenol or ibuprofen as needed, massage, etc. ?-We will start Flexeril 5 mg as needed.  Advised may cause drowsiness. ?-Given exercises ?-For continued or worsening symptoms in 2 weeks consider x-ray of C-spine. ?- Plan: cyclobenzaprine (FLEXERIL) 5 MG tablet ? ?Fall from standing, initial encounter ? ?Strain of neck muscle, initial encounter ?-Supportive care including stretching, massage, topical analgesics, NSAIDs or Tylenol as discussed above. ?- Plan: cyclobenzaprine (FLEXERIL) 5 MG tablet ? ?F/u prn in 2-3 weeks for continued symptoms ? ?Grier Mitts, MD ?

## 2021-06-20 ENCOUNTER — Other Ambulatory Visit: Payer: Self-pay | Admitting: Obstetrics and Gynecology

## 2021-06-20 ENCOUNTER — Ambulatory Visit
Admission: RE | Admit: 2021-06-20 | Discharge: 2021-06-20 | Disposition: A | Discharge status: Home / Self Care | Payer: BC Managed Care – PPO | Source: Ambulatory Visit | Attending: Obstetrics and Gynecology | Admitting: Obstetrics and Gynecology

## 2021-06-20 DIAGNOSIS — N6489 Other specified disorders of breast: Secondary | ICD-10-CM

## 2021-07-01 ENCOUNTER — Other Ambulatory Visit: Payer: Self-pay | Admitting: Family Medicine

## 2021-08-26 ENCOUNTER — Other Ambulatory Visit: Payer: Self-pay | Admitting: Family Medicine

## 2021-08-26 DIAGNOSIS — I10 Essential (primary) hypertension: Secondary | ICD-10-CM

## 2021-11-21 ENCOUNTER — Other Ambulatory Visit: Payer: Self-pay | Admitting: Obstetrics and Gynecology

## 2021-11-21 DIAGNOSIS — N611 Abscess of the breast and nipple: Secondary | ICD-10-CM

## 2021-11-29 ENCOUNTER — Ambulatory Visit
Admission: RE | Admit: 2021-11-29 | Discharge: 2021-11-29 | Disposition: A | Discharge status: Home / Self Care | Payer: BC Managed Care – PPO | Source: Ambulatory Visit | Attending: Obstetrics and Gynecology | Admitting: Obstetrics and Gynecology

## 2021-11-29 ENCOUNTER — Ambulatory Visit: Admission: RE | Admit: 2021-11-29 | Payer: BC Managed Care – PPO | Source: Ambulatory Visit

## 2021-11-29 DIAGNOSIS — N611 Abscess of the breast and nipple: Secondary | ICD-10-CM

## 2021-12-16 LAB — HM PAP SMEAR: HM Pap smear: NORMAL

## 2022-01-01 ENCOUNTER — Ambulatory Visit
Admission: RE | Admit: 2022-01-01 | Discharge: 2022-01-01 | Disposition: A | Discharge status: Home / Self Care | Payer: BC Managed Care – PPO | Source: Ambulatory Visit | Attending: Obstetrics and Gynecology | Admitting: Obstetrics and Gynecology

## 2022-01-01 DIAGNOSIS — N6489 Other specified disorders of breast: Secondary | ICD-10-CM

## 2022-02-07 ENCOUNTER — Ambulatory Visit (INDEPENDENT_AMBULATORY_CARE_PROVIDER_SITE_OTHER): Payer: BC Managed Care – PPO | Admitting: Family Medicine

## 2022-02-07 ENCOUNTER — Encounter: Payer: BC Managed Care – PPO | Admitting: Family Medicine

## 2022-02-07 ENCOUNTER — Encounter: Payer: Self-pay | Admitting: Family Medicine

## 2022-02-07 VITALS — BP 94/60 | HR 101 | Temp 98.4°F | Ht 60.0 in | Wt 214.8 lb

## 2022-02-07 DIAGNOSIS — Z0000 Encounter for general adult medical examination without abnormal findings: Secondary | ICD-10-CM

## 2022-02-07 DIAGNOSIS — R0683 Snoring: Secondary | ICD-10-CM

## 2022-02-07 DIAGNOSIS — E0590 Thyrotoxicosis, unspecified without thyrotoxic crisis or storm: Secondary | ICD-10-CM

## 2022-02-07 DIAGNOSIS — H66001 Acute suppurative otitis media without spontaneous rupture of ear drum, right ear: Secondary | ICD-10-CM

## 2022-02-07 DIAGNOSIS — M2550 Pain in unspecified joint: Secondary | ICD-10-CM

## 2022-02-07 DIAGNOSIS — I10 Essential (primary) hypertension: Secondary | ICD-10-CM | POA: Diagnosis not present

## 2022-02-07 DIAGNOSIS — R5383 Other fatigue: Secondary | ICD-10-CM

## 2022-02-07 DIAGNOSIS — E782 Mixed hyperlipidemia: Secondary | ICD-10-CM

## 2022-02-07 DIAGNOSIS — M65331 Trigger finger, right middle finger: Secondary | ICD-10-CM

## 2022-02-07 DIAGNOSIS — R7303 Prediabetes: Secondary | ICD-10-CM

## 2022-02-07 LAB — CBC WITH DIFFERENTIAL/PLATELET
Basophils Absolute: 0 10*3/uL (ref 0.0–0.1)
Basophils Relative: 1 % (ref 0.0–3.0)
Eosinophils Absolute: 0.1 10*3/uL (ref 0.0–0.7)
Eosinophils Relative: 3 % (ref 0.0–5.0)
HCT: 44.8 % (ref 36.0–46.0)
Hemoglobin: 15.4 g/dL — ABNORMAL HIGH (ref 12.0–15.0)
Lymphocytes Relative: 33.5 % (ref 12.0–46.0)
Lymphs Abs: 1.5 10*3/uL (ref 0.7–4.0)
MCHC: 34.5 g/dL (ref 30.0–36.0)
MCV: 94.2 fl (ref 78.0–100.0)
Monocytes Absolute: 0.3 10*3/uL (ref 0.1–1.0)
Monocytes Relative: 6.5 % (ref 3.0–12.0)
Neutro Abs: 2.5 10*3/uL (ref 1.4–7.7)
Neutrophils Relative %: 56 % (ref 43.0–77.0)
Platelets: 221 10*3/uL (ref 150.0–400.0)
RBC: 4.75 Mil/uL (ref 3.87–5.11)
RDW: 13.8 % (ref 11.5–15.5)
WBC: 4.4 10*3/uL (ref 4.0–10.5)

## 2022-02-07 LAB — BASIC METABOLIC PANEL
BUN: 11 mg/dL (ref 6–23)
CO2: 27 mEq/L (ref 19–32)
Calcium: 10.4 mg/dL (ref 8.4–10.5)
Chloride: 99 mEq/L (ref 96–112)
Creatinine, Ser: 0.63 mg/dL (ref 0.40–1.20)
GFR: 101.58 mL/min (ref >60.00)
Glucose, Bld: 118 mg/dL — ABNORMAL HIGH (ref 70–99)
Potassium: 3.6 mEq/L (ref 3.5–5.1)
Sodium: 136 mEq/L (ref 135–145)

## 2022-02-07 LAB — VITAMIN B12: Vitamin B-12: 560 pg/mL (ref 211–911)

## 2022-02-07 LAB — LIPID PANEL
Cholesterol: 239 mg/dL — ABNORMAL HIGH (ref 0–200)
HDL: 64.4 mg/dL (ref >39.00)
LDL Cholesterol: 158 mg/dL — ABNORMAL HIGH (ref 0–99)
NonHDL: 174.12
Total CHOL/HDL Ratio: 4
Triglycerides: 80 mg/dL (ref 0.0–149.0)
VLDL: 16 mg/dL (ref 0.0–40.0)

## 2022-02-07 LAB — TSH: TSH: 0.65 u[IU]/mL (ref 0.35–5.50)

## 2022-02-07 LAB — HEMOGLOBIN A1C: Hgb A1c MFr Bld: 6.3 % (ref 4.6–6.5)

## 2022-02-07 LAB — VITAMIN D 25 HYDROXY (VIT D DEFICIENCY, FRACTURES): VITD: 43.16 ng/mL (ref 30.00–100.00)

## 2022-02-07 MED ORDER — AMILORIDE-HYDROCHLOROTHIAZIDE 5-50 MG PO TABS: 0.5000 | ORAL_TABLET | Freq: Every day | ORAL | 1 refills | Status: DC

## 2022-02-07 MED ORDER — AMOXICILLIN-POT CLAVULANATE 500-125 MG PO TABS: 1.0000 | ORAL_TABLET | Freq: Two times a day (BID) | ORAL | 0 refills | Status: AC

## 2022-02-07 NOTE — Progress Notes (Signed)
Subjective:     Danielle Day is a 52 y.o. female and is here for a comprehensive physical exam. The patient reports clogged right ear and popping/pain with swallowing x 1 week.  Patient also notes joint aches.  Left elbow, right hand greater than left, hips, knees.  Patient attributes left hip ache to history of left knee surgery.  Also notes R foot ache and history of R foot surgery.  Right middle finger often become swollen and painful to extended once bent.  Patient endorses continued fatigue.  Occasionally wakes up in the morning feeling tired/unrested.  Endorses being told she snores.  Pap 11/11-23 with Dr. Garwin Brothers.  Mammogram done 01/01/2022, colonoscopy 07/21/2021.  Social History   Socioeconomic History   Marital status: Married    Spouse name: Not on file   Number of children: Not on file   Years of education: Not on file   Highest education level: Not on file  Occupational History   Not on file  Tobacco Use   Smoking status: Never   Smokeless tobacco: Never  Substance and Sexual Activity   Alcohol use: Not on file   Drug use: Not on file   Sexual activity: Not on file  Other Topics Concern   Not on file  Social History Narrative   Not on file   Social Determinants of Health   Financial Resource Strain: Not on file  Food Insecurity: Not on file  Transportation Needs: Not on file  Physical Activity: Not on file  Stress: Not on file  Social Connections: Not on file  Intimate Partner Violence: Not on file   Health Maintenance  Topic Date Due   HIV Screening  Never done   Hepatitis C Screening  Never done   Zoster Vaccines- Shingrix (1 of 2) Never done   COVID-19 Vaccine (2 - 2023-24 season) 02/23/2022 (Originally 10/06/2021)   INFLUENZA VACCINE  05/06/2022 (Originally 09/05/2021)   MAMMOGRAM  01/02/2024   PAP SMEAR-Modifier  12/16/2024   DTaP/Tdap/Td (2 - Td or Tdap) 11/16/2026   COLONOSCOPY (Pts 45-75yrs Insurance coverage will need to be confirmed)   07/22/2031   HPV VACCINES  Aged Out    The following portions of the patient's history were reviewed and updated as appropriate: allergies, current medications, past family history, past medical history, past social history, past surgical history, and problem list.  Review of Systems Pertinent items noted in HPI and remainder of comprehensive ROS otherwise negative.   Objective:    BP 94/60 (BP Location: Right Arm, Patient Position: Sitting, Cuff Size: Large)   Pulse (!) 101   Temp 98.4 F (36.9 C) (Oral)   Ht 5' (1.524 m)   Wt 214 lb 12.8 oz (97.4 kg)   LMP 06/22/2011 Comment: Postmenopausal  SpO2 96%   BMI 41.95 kg/m  General appearance: alert, cooperative, and no distress Head: Normocephalic, without obvious abnormality, atraumatic Eyes: conjunctivae/corneas clear. PERRL, EOM's intact. Fundi benign. Ears: normal L TM and external ear canals both ears.  Right TM dull, with erythema and suppurative fluid. Nose: Nares normal. Septum midline. Mucosa normal. No drainage or sinus tenderness. Throat: lips, mucosa, and tongue normal; teeth and gums normal Neck: no adenopathy, no carotid bruit, no JVD, supple, symmetrical, trachea midline, and thyroid not enlarged, symmetric, no tenderness/mass/nodules Lungs: clear to auscultation bilaterally Heart: regular rate and rhythm, S1, S2 normal, no murmur, click, rub or gallop Abdomen: soft, non-tender; bowel sounds normal; no masses,  no organomegaly Extremities: extremities normal, atraumatic, no cyanosis  or edema Pulses: 2+ and symmetric Skin: Skin color, texture, turgor normal. No rashes or lesions Lymph nodes: Cervical, supraclavicular, and axillary nodes normal. Neurologic: Alert and oriented X 3, normal strength and tone. Normal symmetric reflexes. Normal coordination and gait    Assessment:    Healthy female exam.      Plan:  Well adult  Anticipatory guidance given including wearing seatbelts, smoke detectors in the home,  increasing physical activity, increasing p.o. intake of water and vegetables. -labs -Colonoscopy done 07/21/2021 -Mammogram done 01/01/2022 -Pap done 12/16/2021 -Immunizations reviewed -Next CPE in 1 year See After Visit Summary for Counseling Recommendations   Essential hypertension  -Controlled -Continue current medications including amiloride-hydrochlorothiazide 5-50 mg half tab daily - Plan: Basic metabolic panel, Ambulatory referral to Pulmonology  Fatigue, unspecified type -Labs and at home sleep study  - Plan: CBC with Differential/Platelet, TSH, Hemoglobin A1c, Vitamin B12, Vitamin D, 25-hydroxy, Ambulatory referral to Pulmonology  Snoring  -May be contributing to intermittent fatigue - Plan: CBC with Differential/Platelet, Ambulatory referral to Pulmonology  Acute suppurative otitis media of right ear without spontaneous rupture of tympanic membrane, recurrence not specified  - Plan: amoxicillin-clavulanate (AUGMENTIN) 500-125 MG tablet  Trigger middle finger of right hand -Given handout -Discussed treatment options -Ortho as needed  Mixed hyperlipidemia -Lifestyle modifications - Plan: Lipid panel  Hyperthyroidism  - Plan: TSH  Multiple joint pain -Discussed possible causes including arthritis, tendinitis of elbow -Discussed supportive care, wearing supportive shoes -For continued symptoms obtain imaging -Continue follow-up with Ortho for history of L knee surgery  - Plan: CBC with Differential/Platelet  Prediabetes -Hemoglobin A1c 6.1% on 01/26/2021  - Plan: Hemoglobin A1c  F/u prn  Grier Mitts, MD

## 2022-02-07 NOTE — Addendum Note (Signed)
Addended by: Grier Mitts R on: 02/07/2022 12:36 PM   Modules accepted: Level of Service

## 2022-02-07 NOTE — Patient Instructions (Signed)
A referral for sleep study was placed with pulmonology.  You should expect a phone call about scheduling this appointment  Prescription for Augmentin was sent to your pharmacy for your ear infection.  I hope you have a very happy birthday tomorrow.

## 2022-02-23 ENCOUNTER — Other Ambulatory Visit: Payer: Self-pay | Admitting: Family Medicine

## 2022-02-23 DIAGNOSIS — I10 Essential (primary) hypertension: Secondary | ICD-10-CM

## 2022-03-04 ENCOUNTER — Other Ambulatory Visit: Payer: Self-pay | Admitting: Family Medicine

## 2022-03-04 DIAGNOSIS — J302 Other seasonal allergic rhinitis: Secondary | ICD-10-CM

## 2022-03-18 ENCOUNTER — Other Ambulatory Visit: Payer: Self-pay | Admitting: Family Medicine

## 2022-03-18 DIAGNOSIS — J302 Other seasonal allergic rhinitis: Secondary | ICD-10-CM

## 2022-07-05 ENCOUNTER — Telehealth: Payer: Self-pay | Admitting: Family Medicine

## 2022-07-05 ENCOUNTER — Other Ambulatory Visit: Payer: Self-pay | Admitting: Family Medicine

## 2022-07-05 DIAGNOSIS — I10 Essential (primary) hypertension: Secondary | ICD-10-CM

## 2022-07-05 NOTE — Telephone Encounter (Signed)
Prescription Request  07/05/2022  LOV: 02/07/2022  What is the name of the medication or equipment?     amiloride-hydrochlorothiazide (MODURETIC) 5-50 MG tablet,metoprolol  Have you contacted your pharmacy to request a refill? Yes   Which pharmacy would you like this sent to?  [id number] [place], [place] - [address] AT [facility] [address] [place] [address] Phone: [phone] Fax: [phone]    Patient notified that their request is being sent to the clinical staff for review and that they should receive a response within 2 business days.   Please advise at Mobile [phone] (mobile)

## 2022-07-05 NOTE — Telephone Encounter (Signed)
Refill request was received and already responded to.

## 2022-07-06 ENCOUNTER — Other Ambulatory Visit: Payer: Self-pay | Admitting: Family Medicine

## 2022-07-09 NOTE — Telephone Encounter (Signed)
Pt called to say the pharmacy told her the metoprolol succinate (TOPROL-XL) 25 MG 24 hr tablet  was denied and she would like to know why?  Pt is completely out of this Rx.  Pt was last seen on 02/07/22 = CPE  Please advise.

## 2022-07-10 NOTE — Telephone Encounter (Signed)
Pt is calling back checking on the reason why the below bp med was d/c and would like a callback

## 2022-07-10 NOTE — Telephone Encounter (Signed)
Pt called back checking on progress of this request regarding refill for metoprolol succinate (TOPROL-XL) 25 MG 24 hr tablet

## 2022-07-11 ENCOUNTER — Other Ambulatory Visit: Payer: Self-pay | Admitting: Family Medicine

## 2022-07-13 ENCOUNTER — Other Ambulatory Visit: Payer: Self-pay | Admitting: Family Medicine

## 2022-07-13 NOTE — Progress Notes (Signed)
Error

## 2022-08-22 ENCOUNTER — Encounter: Payer: Self-pay | Admitting: Family Medicine

## 2022-08-22 ENCOUNTER — Ambulatory Visit: Payer: BC Managed Care – PPO | Admitting: Family Medicine

## 2022-08-22 DIAGNOSIS — I10 Essential (primary) hypertension: Secondary | ICD-10-CM | POA: Diagnosis not present

## 2022-08-22 MED ORDER — METOPROLOL SUCCINATE ER 25 MG PO TB24
ORAL_TABLET | ORAL | 3 refills | Status: DC
Start: 2022-08-22 — End: 2022-10-10

## 2022-08-22 MED ORDER — AMILORIDE-HYDROCHLOROTHIAZIDE 5-50 MG PO TABS: 0.5000 | ORAL_TABLET | Freq: Every day | ORAL | 3 refills | Status: DC

## 2022-08-22 NOTE — Progress Notes (Signed)
   Established Patient Office Visit   Subjective  Patient ID: Danielle Day, female    DOB: 05-Jul-1970  Age: 52 y.o. MRN: [id number]  Chief Complaint  Patient presents with   Medical Management of Chronic Issues    BP    Pt is a 52 yo female seen for f/u on HTN.  Pt states bp has been great, in the 90s-100s systolic.  Pt denies dizziness, HA.  Noticed mild edema which she attributes to the warmer weather.  Taking Toprol-XL 25 mg and amiloride-hydrochlorothiazide half a 5-50 mg tab daily.    Pt is on summer break from teaching.  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  Pt and family going on vacation next wk.    Past Medical History:  Diagnosis Date   Essential hypertension 10/02/2006   Qualifier: Diagnosis of  By: Everardo All MD, Cleophas Dunker    GOITER, MULTINODULAR 09/11/2007   Qualifier: Diagnosis of  By: Everardo All MD, Sean A    HYPERGLYCEMIA 09/11/2007   Qualifier: Diagnosis of  By: Everardo All MD, Sean A    Hyperthyroidism 10/02/2006   Qualifier: Diagnosis of  By: Everardo All MD, Gregary Signs A    History reviewed. No pertinent surgical history. Social History   Tobacco Use   Smoking status: Never   Smokeless tobacco: Never   Family History  Problem Relation Age of Onset   Breast cancer Maternal Grandmother    Allergies  Allergen Reactions   Aspirin Palpitations   Caffeine Palpitations      ROS Negative unless stated above    Objective:     BP 108/76 (BP Location: Left Arm, Patient Position: Sitting, Cuff Size: Large)   Pulse 66   Temp 98.1 F (36.7 C) (Oral)   Wt 209 lb 3.2 oz (94.9 kg)   LMP 06/22/2011 Comment: Postmenopausal  SpO2 95%   BMI 40.86 kg/m    Physical Exam Constitutional:      General: She is not in acute distress.    Appearance: Normal appearance.  HENT:     Head: Normocephalic and atraumatic.     Nose: Nose normal.     Mouth/Throat:     Mouth: Mucous membranes are moist.  Cardiovascular:     Rate and Rhythm: Normal rate and regular  rhythm.     Heart sounds: Normal heart sounds. No murmur heard.    No gallop.  Pulmonary:     Effort: Pulmonary effort is normal. No respiratory distress.     Breath sounds: Normal breath sounds. No wheezing, rhonchi or rales.  Skin:    General: Skin is warm and dry.  Neurological:     Mental Status: She is alert and oriented to person, place, and time.      No results found for any visits on 08/22/22.    Assessment & Plan:  Essential hypertension -     Metoprolol Succinate ER; TAKE 1 TABLET(25 MG) BY MOUTH AT BEDTIME  Dispense: 90 tablet; Refill: 3 -     aMILoride-hydroCHLOROthiazide; Take 0.5 tablets by mouth daily.  Dispense: 45 tablet; Refill: 3  BP well controlled.  Continue Toprol-XL 25 mg daily and half a tab of Amiloride-hydrochlorothiazide 5-50 mg daily.  Monitor for  hypotension.  If noted will decrease medication.  Continue lifestyle modifications.  Congratulated on 6 lb wt. loss.  Return in about 6 months (around 02/22/2023)., sooner if needed.   Deeann Saint, MD

## 2022-10-07 ENCOUNTER — Other Ambulatory Visit: Payer: Self-pay | Admitting: Family Medicine

## 2022-10-07 DIAGNOSIS — I10 Essential (primary) hypertension: Secondary | ICD-10-CM

## 2022-12-17 ENCOUNTER — Encounter: Payer: Self-pay | Admitting: Obstetrics and Gynecology

## 2022-12-17 DIAGNOSIS — Z1231 Encounter for screening mammogram for malignant neoplasm of breast: Secondary | ICD-10-CM

## 2023-01-21 ENCOUNTER — Other Ambulatory Visit: Payer: Self-pay | Admitting: Obstetrics and Gynecology

## 2023-01-21 DIAGNOSIS — N6489 Other specified disorders of breast: Secondary | ICD-10-CM

## 2023-02-12 ENCOUNTER — Ambulatory Visit
Admission: RE | Admit: 2023-02-12 | Discharge: 2023-02-12 | Disposition: A | Discharge status: Home / Self Care | Payer: 59 | Source: Ambulatory Visit | Attending: Obstetrics and Gynecology | Admitting: Obstetrics and Gynecology

## 2023-02-12 DIAGNOSIS — N6489 Other specified disorders of breast: Secondary | ICD-10-CM

## 2023-02-25 ENCOUNTER — Encounter: Payer: 59 | Admitting: Family Medicine

## 2023-03-19 ENCOUNTER — Other Ambulatory Visit: Payer: Self-pay | Admitting: Family Medicine

## 2023-03-19 DIAGNOSIS — J302 Other seasonal allergic rhinitis: Secondary | ICD-10-CM

## 2023-03-20 ENCOUNTER — Ambulatory Visit: Payer: 59 | Admitting: Family Medicine

## 2023-03-20 ENCOUNTER — Encounter: Payer: Self-pay | Admitting: Family Medicine

## 2023-03-20 VITALS — BP 110/80 | HR 95 | Temp 98.0°F | Ht 60.0 in | Wt 216.6 lb

## 2023-03-20 DIAGNOSIS — J302 Other seasonal allergic rhinitis: Secondary | ICD-10-CM | POA: Diagnosis not present

## 2023-03-20 DIAGNOSIS — I10 Essential (primary) hypertension: Secondary | ICD-10-CM | POA: Diagnosis not present

## 2023-03-20 DIAGNOSIS — R7303 Prediabetes: Secondary | ICD-10-CM | POA: Diagnosis not present

## 2023-03-20 DIAGNOSIS — Z124 Encounter for screening for malignant neoplasm of cervix: Secondary | ICD-10-CM

## 2023-03-20 DIAGNOSIS — Z0000 Encounter for general adult medical examination without abnormal findings: Secondary | ICD-10-CM | POA: Diagnosis not present

## 2023-03-20 DIAGNOSIS — Z1159 Encounter for screening for other viral diseases: Secondary | ICD-10-CM

## 2023-03-20 LAB — CBC WITH DIFFERENTIAL/PLATELET
Basophils Absolute: 0 10*3/uL (ref 0.0–0.1)
Basophils Relative: 0.9 % (ref 0.0–3.0)
Eosinophils Absolute: 0.2 10*3/uL (ref 0.0–0.7)
Eosinophils Relative: 3.6 % (ref 0.0–5.0)
HCT: 45.2 % (ref 36.0–46.0)
Hemoglobin: 15.2 g/dL — ABNORMAL HIGH (ref 12.0–15.0)
Lymphocytes Relative: 40.3 % (ref 12.0–46.0)
Lymphs Abs: 1.8 10*3/uL (ref 0.7–4.0)
MCHC: 33.7 g/dL (ref 30.0–36.0)
MCV: 96.9 fL (ref 78.0–100.0)
Monocytes Absolute: 0.4 10*3/uL (ref 0.1–1.0)
Monocytes Relative: 9.3 % (ref 3.0–12.0)
Neutro Abs: 2 10*3/uL (ref 1.4–7.7)
Neutrophils Relative %: 45.9 % (ref 43.0–77.0)
Platelets: 234 10*3/uL (ref 150.0–400.0)
RBC: 4.66 Mil/uL (ref 3.87–5.11)
RDW: 13.5 % (ref 11.5–15.5)
WBC: 4.4 10*3/uL (ref 4.0–10.5)

## 2023-03-20 LAB — COMPREHENSIVE METABOLIC PANEL
ALT: 16 U/L (ref 0–35)
AST: 21 U/L (ref 0–37)
Albumin: 4.5 g/dL (ref 3.5–5.2)
Alkaline Phosphatase: 57 U/L (ref 39–117)
BUN: 8 mg/dL (ref 6–23)
CO2: 29 meq/L (ref 19–32)
Calcium: 10 mg/dL (ref 8.4–10.5)
Chloride: 100 meq/L (ref 96–112)
Creatinine, Ser: 0.64 mg/dL (ref 0.40–1.20)
GFR: 100.41 mL/min (ref >60.00)
Glucose, Bld: 99 mg/dL (ref 70–99)
Potassium: 3.9 meq/L (ref 3.5–5.1)
Sodium: 137 meq/L (ref 135–145)
Total Bilirubin: 0.5 mg/dL (ref 0.2–1.2)
Total Protein: 8.3 g/dL (ref 6.0–8.3)

## 2023-03-20 LAB — LIPID PANEL
Cholesterol: 227 mg/dL — ABNORMAL HIGH (ref 0–200)
HDL: 69.7 mg/dL (ref >39.00)
LDL Cholesterol: 140 mg/dL — ABNORMAL HIGH (ref 0–99)
NonHDL: 157.38
Total CHOL/HDL Ratio: 3
Triglycerides: 86 mg/dL (ref 0.0–149.0)
VLDL: 17.2 mg/dL (ref 0.0–40.0)

## 2023-03-20 LAB — T4, FREE: Free T4: 0.71 ng/dL (ref 0.60–1.60)

## 2023-03-20 LAB — HEMOGLOBIN A1C: Hgb A1c MFr Bld: 6.3 % (ref 4.6–6.5)

## 2023-03-20 LAB — TSH: TSH: 0.57 u[IU]/mL (ref 0.35–5.50)

## 2023-03-20 MED ORDER — AMILORIDE-HYDROCHLOROTHIAZIDE 5-50 MG PO TABS
0.5000 | ORAL_TABLET | Freq: Every day | ORAL | 3 refills | Status: DC
Start: 2023-03-20 — End: 2023-08-19

## 2023-03-20 MED ORDER — METOPROLOL SUCCINATE ER 25 MG PO TB24
ORAL_TABLET | ORAL | 3 refills | Status: DC
Start: 2023-03-20 — End: 2023-09-11

## 2023-03-20 NOTE — Progress Notes (Signed)
Established Patient Office Visit   Subjective  Patient ID: Danielle Day, female    DOB: 1970-05-07  Age: 53 y.o. MRN: [id number]  Chief Complaint  Patient presents with   Annual Exam    Pt is a 53  yo female seen for CPE.  Pt states she has been doing well and is without complaint.  Bp has been stable.  Doing well on current meds.   Patient considering shingles vaccine.  Had chickenpox in her 62s.    Patient Active Problem List   Diagnosis Date Noted   Sinus tachycardia 02/01/2021   Prediabetes 01/15/2019   Mixed hyperlipidemia 01/15/2019   Allergic rhinitis 11/15/2016   Preventative health care 08/23/2015   GOITER, MULTINODULAR 09/11/2007   HYPERGLYCEMIA 09/11/2007   Hyperthyroidism 10/02/2006   Essential hypertension 10/02/2006   Past Medical History:  Diagnosis Date   Essential hypertension 10/02/2006   Qualifier: Diagnosis of  By: Everardo All MD, Cleophas Dunker    GOITER, MULTINODULAR 09/11/2007   Qualifier: Diagnosis of  By: Everardo All MD, Sean A    HYPERGLYCEMIA 09/11/2007   Qualifier: Diagnosis of  By: Everardo All MD, Sean A    Hyperthyroidism 10/02/2006   Qualifier: Diagnosis of  By: Everardo All MD, Gregary Signs A    History reviewed. No pertinent surgical history. Social History   Tobacco Use   Smoking status: Never   Smokeless tobacco: Never   Family History  Problem Relation Age of Onset   Breast cancer Maternal Grandmother    Allergies  Allergen Reactions   Aspirin Palpitations   Caffeine Palpitations      ROS Negative unless stated above    Objective:     BP 110/80 (BP Location: Left Arm, Patient Position: Sitting, Cuff Size: Large)   Pulse 95   Temp 98 F (36.7 C) (Oral)   Ht 5' (1.524 m)   Wt 216 lb 9.6 oz (98.2 kg)   LMP 06/22/2011 Comment: Postmenopausal  SpO2 97%   BMI 42.30 kg/m  BP Readings from Last 3 Encounters:  03/20/23 110/80  08/22/22 108/76  02/07/22 94/60   Wt Readings from Last 3 Encounters:  03/20/23 216 lb 9.6 oz (98.2 kg)  08/22/22  209 lb 3.2 oz (94.9 kg)  02/07/22 214 lb 12.8 oz (97.4 kg)      Physical Exam Constitutional:      Appearance: Normal appearance.  HENT:     Head: Normocephalic and atraumatic.     Right Ear: Tympanic membrane, ear canal and external ear normal.     Left Ear: Tympanic membrane, ear canal and external ear normal.     Nose: Nose normal.     Mouth/Throat:     Mouth: Mucous membranes are moist.     Pharynx: No oropharyngeal exudate or posterior oropharyngeal erythema.  Eyes:     General: No scleral icterus.    Extraocular Movements: Extraocular movements intact.     Conjunctiva/sclera: Conjunctivae normal.     Pupils: Pupils are equal, round, and reactive to light.  Neck:     Thyroid: No thyromegaly.  Cardiovascular:     Rate and Rhythm: Normal rate and regular rhythm.     Pulses: Normal pulses.     Heart sounds: Normal heart sounds. No murmur heard.    No friction rub.  Pulmonary:     Effort: Pulmonary effort is normal.     Breath sounds: Normal breath sounds. No wheezing, rhonchi or rales.  Abdominal:     General: Bowel sounds are normal.  Palpations: Abdomen is soft.     Tenderness: There is no abdominal tenderness.  Musculoskeletal:        General: No deformity. Normal range of motion.  Lymphadenopathy:     Cervical: No cervical adenopathy.  Skin:    General: Skin is warm and dry.     Findings: No lesion.  Neurological:     General: No focal deficit present.     Mental Status: She is alert and oriented to person, place, and time.  Psychiatric:        Mood and Affect: Mood normal.        Thought Content: Thought content normal.       03/20/2023   10:49 AM 08/22/2022   10:38 AM 02/07/2022    9:11 AM  Depression screen PHQ 2/9  Decreased Interest 0 0 0  Down, Depressed, Hopeless 0 0 0  PHQ - 2 Score 0 0 0  Altered sleeping 0  0  Tired, decreased energy 0  0  Change in appetite 0  0  Feeling bad or failure about yourself  0  0  Trouble concentrating 0  0   Moving slowly or fidgety/restless 0  0  Suicidal thoughts 0  0  PHQ-9 Score 0  0  Difficult doing work/chores Not difficult at all        03/20/2023   10:49 AM 08/22/2022   10:38 AM  GAD 7 : Generalized Anxiety Score  Nervous, Anxious, on Edge 0 0  Control/stop worrying 0 0  Worry too much - different things 0 0  Trouble relaxing 0 0  Restless 0 0  Easily annoyed or irritable 0 0  Afraid - awful might happen 0 0  Total GAD 7 Score 0 0  Anxiety Difficulty Not difficult at all       No results found for any visits on 03/20/23.    Assessment & Plan:  Well adult exam -Age-appropriate health screenings discussed.  Anticipatory guidance given including wearing seatbelts, smoke detectors in the home, increasing physical activity, increasing p.o. intake of water and vegetables. -Obtain labs -Mammogram done 02/12/2023 -Colonoscopy done 07/21/2021 -Pap done 12/16/2021 with OB/GYN -Immunizations reviewed.  Patient considering Shingrix. -     Lipid panel; Future -     T4, free; Future  Seasonal allergies -Continue OTC antihistamine such as Zyrtec. -     CBC with Differential/Platelet; Future  Essential hypertension -Well-controlled -Continue Toprol ER 25 mg nightly and amiloride-hydrochlorothiazide 5-50 mg take half tab daily -Continue lifestyle modifications. -     Metoprolol Succinate ER; TAKE 1 TABLET(25 MG) BY MOUTH AT BEDTIME  Dispense: 90 tablet; Refill: 3 -     aMILoride-hydroCHLOROthiazide; Take 0.5 tablets by mouth daily.  Dispense: 45 tablet; Refill: 3 -     Comprehensive metabolic panel; Future -     Lipid panel; Future -     TSH; Future -     T4, free; Future  Prediabetes -Hemoglobin A1c 6.3% on 02/07/2022 -Lifestyle modifications encouraged including weight loss -     Hemoglobin A1c; Future  Encounter for hepatitis C screening test for low risk patient -     Hepatitis C antibody   Return in about 6 months (around 09/17/2023), or as needed.   Deeann Saint,  MD

## 2023-03-21 ENCOUNTER — Encounter: Payer: Self-pay | Admitting: Family Medicine

## 2023-03-21 LAB — HEPATITIS C ANTIBODY: Hepatitis C Ab: NONREACTIVE

## 2023-08-18 ENCOUNTER — Other Ambulatory Visit: Payer: Self-pay | Admitting: Family Medicine

## 2023-08-18 DIAGNOSIS — I10 Essential (primary) hypertension: Secondary | ICD-10-CM

## 2023-09-11 ENCOUNTER — Ambulatory Visit: Payer: 59 | Admitting: Family Medicine

## 2023-09-11 ENCOUNTER — Encounter: Payer: Self-pay | Admitting: Family Medicine

## 2023-09-11 VITALS — BP 106/70 | HR 111 | Temp 98.3°F | Ht 60.0 in | Wt 204.0 lb

## 2023-09-11 DIAGNOSIS — R7303 Prediabetes: Secondary | ICD-10-CM | POA: Diagnosis not present

## 2023-09-11 DIAGNOSIS — I10 Essential (primary) hypertension: Secondary | ICD-10-CM | POA: Diagnosis not present

## 2023-09-11 DIAGNOSIS — E0590 Thyrotoxicosis, unspecified without thyrotoxic crisis or storm: Secondary | ICD-10-CM

## 2023-09-11 DIAGNOSIS — E782 Mixed hyperlipidemia: Secondary | ICD-10-CM | POA: Diagnosis not present

## 2023-09-11 LAB — LIPID PANEL
Cholesterol: 213 mg/dL — ABNORMAL HIGH (ref 0–200)
HDL: 57 mg/dL (ref >39.00)
LDL Cholesterol: 139 mg/dL — ABNORMAL HIGH (ref 0–99)
NonHDL: 155.89
Total CHOL/HDL Ratio: 4
Triglycerides: 82 mg/dL (ref 0.0–149.0)
VLDL: 16.4 mg/dL (ref 0.0–40.0)

## 2023-09-11 LAB — POCT GLYCOSYLATED HEMOGLOBIN (HGB A1C): Hemoglobin A1C: 5.9 % — AB (ref 4.0–5.6)

## 2023-09-11 NOTE — Progress Notes (Signed)
 Established Patient Office Visit   Subjective  Patient ID: Danielle Day, female    DOB: [birth year]  Age: 53 y.o. MRN: [id number]  Chief Complaint  Patient presents with   Medical Management of Chronic Issues    6 month follow-up for pre-diabetes and Blood pressure     Pt is Day 53 year old female seen for follow-up on chronic conditions.  Patient states BP has been well-controlled at home typically 117/75.  Patient taking amiloride -HCTZ half tab daily.  Working to wean off of Toprol  XL, started taking Day half tab every other day.  Patient making changes to diet.  Has lost 13 pounds since February.  Hoping A1c is lower.  Patient has enjoyed her summer spending time with grandkids.  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  Her middle son is getting married in October.    Patient Active Problem List   Diagnosis Date Noted   Sinus tachycardia 02/01/2021   Prediabetes 01/15/2019   Mixed hyperlipidemia 01/15/2019   Allergic rhinitis 11/15/2016   Preventative health care 08/23/2015   GOITER, MULTINODULAR 09/11/2007   HYPERGLYCEMIA 09/11/2007   Hyperthyroidism 10/02/2006   Essential hypertension 10/02/2006   Past Medical History:  Diagnosis Date   Essential hypertension 10/02/2006   Qualifier: Diagnosis of  By: Kassie MD, Danielle Day LABOR    GOITER, MULTINODULAR 09/11/2007   Qualifier: Diagnosis of  By: Kassie MD, Danielle Day    HYPERGLYCEMIA 09/11/2007   Qualifier: Diagnosis of  By: Kassie MD, Danielle Day    Hyperthyroidism 10/02/2006   Qualifier: Diagnosis of  By: Kassie MD, Danielle Day Day    History reviewed. No pertinent surgical history. Social History   Tobacco Use   Smoking status: Never   Smokeless tobacco: Never   Family History  Problem Relation Age of Onset   Breast cancer Maternal Grandmother    Allergies  Allergen Reactions   Aspirin Palpitations   Caffeine Palpitations    ROS Negative unless stated above    Objective:     LMP 06/22/2011 Comment:  Postmenopausal BP Readings from Last 3 Encounters:  03/20/23 110/80  08/22/22 108/76  02/07/22 94/60   Wt Readings from Last 3 Encounters:  03/20/23 216 lb 9.6 oz (98.2 kg)  08/22/22 209 lb 3.2 oz (94.9 kg)  02/07/22 214 lb 12.8 oz (97.4 kg)      Physical Exam Constitutional:      General: She is not in acute distress.    Appearance: Normal appearance.  HENT:     Head: Normocephalic and atraumatic.     Nose: Nose normal.     Mouth/Throat:     Mouth: Mucous membranes are moist.  Cardiovascular:     Rate and Rhythm: Normal rate and regular rhythm.     Heart sounds: Normal heart sounds. No murmur heard.    No gallop.  Pulmonary:     Effort: Pulmonary effort is normal. No respiratory distress.     Breath sounds: Normal breath sounds. No wheezing, rhonchi or rales.  Skin:    General: Skin is warm and dry.  Neurological:     Mental Status: She is alert and oriented to person, place, and time.        03/20/2023   10:49 AM 08/22/2022   10:38 AM 02/07/2022    9:11 AM  Depression screen PHQ 2/9  Decreased Interest 0 0 0  Down, Depressed, Hopeless 0 0 0  PHQ - 2 Score 0 0 0  Altered sleeping 0  0  Tired, decreased energy 0  0  Change in appetite 0  0  Feeling bad or failure about yourself  0  0  Trouble concentrating 0  0  Moving slowly or fidgety/restless 0  0  Suicidal thoughts 0  0  PHQ-9 Score 0  0  Difficult doing work/chores Not difficult at all        03/20/2023   10:49 AM 08/22/2022   10:38 AM  GAD 7 : Generalized Anxiety Score  Nervous, Anxious, on Edge 0 0  Control/stop worrying 0 0  Worry too much - different things 0 0  Trouble relaxing 0 0  Restless 0 0  Easily annoyed or irritable 0 0  Afraid - awful might happen 0 0  Total GAD 7 Score 0 0  Anxiety Difficulty Not difficult at all      Results for orders placed or performed in visit on 09/11/23  POC HgB A1c  Result Value Ref Range   Hemoglobin A1C 5.9 (Day) 4.0 - 5.6 %   HbA1c POC (<> result,  manual entry)     HbA1c, POC (prediabetic range)     HbA1c, POC (controlled diabetic range)        Assessment & Plan:   Essential hypertension  Prediabetes -     POCT glycosylated hemoglobin (Hb A1C)  Mixed hyperlipidemia -     Lipid panel; Future  HTN well-controlled.  As BP has been low normal will have patient stop Toprol  XL.  Continue amiloride -HCTZ 5-50 mg half tab daily.  Refills recently sent to pharmacy.  Hgb A1C now 5.9%, previously 6..3% on 03/20/23.  Continue lifestyle modifications.  Recheck lipids.  Return in about 6 months (around 03/13/2024).  For CPE.  Danielle Day Danielle Day Single, MD

## 2023-09-13 ENCOUNTER — Ambulatory Visit: Payer: Self-pay | Admitting: Family Medicine

## 2023-10-03 ENCOUNTER — Other Ambulatory Visit: Payer: Self-pay | Admitting: Family Medicine

## 2023-10-03 DIAGNOSIS — I10 Essential (primary) hypertension: Secondary | ICD-10-CM

## 2024-01-16 ENCOUNTER — Encounter: Payer: Self-pay | Admitting: Obstetrics and Gynecology

## 2024-01-16 DIAGNOSIS — Z1231 Encounter for screening mammogram for malignant neoplasm of breast: Secondary | ICD-10-CM

## 2024-01-17 ENCOUNTER — Other Ambulatory Visit: Payer: Self-pay | Admitting: Obstetrics and Gynecology

## 2024-01-17 DIAGNOSIS — N611 Abscess of the breast and nipple: Secondary | ICD-10-CM

## 2024-01-22 ENCOUNTER — Inpatient Hospital Stay
Admission: RE | Admit: 2024-01-22 | Discharge: 2024-01-22 | Attending: Obstetrics and Gynecology | Admitting: Obstetrics and Gynecology

## 2024-01-22 DIAGNOSIS — N611 Abscess of the breast and nipple: Secondary | ICD-10-CM

## 2024-02-21 ENCOUNTER — Other Ambulatory Visit: Payer: Self-pay | Admitting: Obstetrics and Gynecology

## 2024-02-21 DIAGNOSIS — Z1231 Encounter for screening mammogram for malignant neoplasm of breast: Secondary | ICD-10-CM

## 2024-03-04 ENCOUNTER — Ambulatory Visit

## 2024-03-13 ENCOUNTER — Encounter: Payer: Self-pay | Admitting: Family Medicine

## 2024-03-13 ENCOUNTER — Ambulatory Visit: Admitting: Family Medicine

## 2024-03-13 DIAGNOSIS — I10 Essential (primary) hypertension: Secondary | ICD-10-CM

## 2024-03-13 DIAGNOSIS — Z0000 Encounter for general adult medical examination without abnormal findings: Secondary | ICD-10-CM

## 2024-03-13 DIAGNOSIS — E782 Mixed hyperlipidemia: Secondary | ICD-10-CM

## 2024-03-13 DIAGNOSIS — R7303 Prediabetes: Secondary | ICD-10-CM

## 2024-03-13 LAB — COMPREHENSIVE METABOLIC PANEL WITH GFR
ALT: 15 U/L (ref 3–35)
AST: 20 U/L (ref 5–37)
Albumin: 4.4 g/dL (ref 3.5–5.2)
Alkaline Phosphatase: 56 U/L (ref 39–117)
BUN: 9 mg/dL (ref 6–23)
CO2: 27 meq/L (ref 19–32)
Calcium: 9.7 mg/dL (ref 8.4–10.5)
Chloride: 100 meq/L (ref 96–112)
Creatinine, Ser: 0.66 mg/dL (ref 0.40–1.20)
GFR: 98.98 mL/min
Glucose, Bld: 117 mg/dL — ABNORMAL HIGH (ref 70–99)
Potassium: 3.8 meq/L (ref 3.5–5.1)
Sodium: 136 meq/L (ref 135–145)
Total Bilirubin: 0.6 mg/dL (ref 0.2–1.2)
Total Protein: 7.5 g/dL (ref 6.0–8.3)

## 2024-03-13 LAB — CBC WITH DIFFERENTIAL/PLATELET
Basophils Absolute: 0.1 10*3/uL (ref 0.0–0.1)
Basophils Relative: 1.2 % (ref 0.0–3.0)
Eosinophils Absolute: 0.2 10*3/uL (ref 0.0–0.7)
Eosinophils Relative: 3.8 % (ref 0.0–5.0)
HCT: 44.3 % (ref 36.0–46.0)
Hemoglobin: 15.6 g/dL — ABNORMAL HIGH (ref 12.0–15.0)
Lymphocytes Relative: 32.6 % (ref 12.0–46.0)
Lymphs Abs: 1.4 10*3/uL (ref 0.7–4.0)
MCHC: 35.1 g/dL (ref 30.0–36.0)
MCV: 96.4 fl (ref 78.0–100.0)
Monocytes Absolute: 0.4 10*3/uL (ref 0.1–1.0)
Monocytes Relative: 9.5 % (ref 3.0–12.0)
Neutro Abs: 2.3 10*3/uL (ref 1.4–7.7)
Neutrophils Relative %: 52.9 % (ref 43.0–77.0)
Platelets: 188 10*3/uL (ref 150.0–400.0)
RBC: 4.6 Mil/uL (ref 3.87–5.11)
RDW: 13.9 % (ref 11.5–15.5)
WBC: 4.4 10*3/uL (ref 4.0–10.5)

## 2024-03-13 LAB — LIPID PANEL
Cholesterol: 216 mg/dL — ABNORMAL HIGH (ref 28–200)
HDL: 65.3 mg/dL
LDL Cholesterol: 133 mg/dL — ABNORMAL HIGH (ref 10–99)
NonHDL: 150.83
Total CHOL/HDL Ratio: 3
Triglycerides: 89 mg/dL (ref 10.0–149.0)
VLDL: 17.8 mg/dL (ref 0.0–40.0)

## 2024-03-13 LAB — TSH: TSH: 0.77 u[IU]/mL (ref 0.35–5.50)

## 2024-03-13 LAB — HEMOGLOBIN A1C: Hgb A1c MFr Bld: 6 % (ref 4.6–6.5)

## 2024-03-13 LAB — T4, FREE: Free T4: 0.58 ng/dL — ABNORMAL LOW (ref 0.60–1.60)

## 2024-03-13 NOTE — Progress Notes (Signed)
 OFV changed to CPE.  See new encounter.

## 2024-03-13 NOTE — Progress Notes (Signed)
 "  Established Patient Office Visit   Subjective  Patient ID: Danielle Day, female    DOB: 1970/07/27  Age: 54 y.o. MRN: [id number]  Chief Complaint  Patient presents with   Annual Exam    Patient is a 54 year old female seen for CPE.  Patient is fasting.  Patient states she has been doing well overall.  Taking Toprol -XL a half tab 12.5 mg daily and amiloride -hydrochlorothiazide  5-50 mg daily.  BP at home typically 115-122/70s.  Patient started doing yoga weekly.  Lost 16 pounds since last OFV.  Considering getting shingles vaccine.  Had chickenpox as an adult.  Had well woman exam with OB/GYN earlier this year.    Patient Active Problem List   Diagnosis Date Noted   Sinus tachycardia 02/01/2021   Prediabetes 01/15/2019   Mixed hyperlipidemia 01/15/2019   Allergic rhinitis 11/15/2016   Preventative health care 08/23/2015   GOITER, MULTINODULAR 09/11/2007   HYPERGLYCEMIA 09/11/2007   Hyperthyroidism 10/02/2006   Essential hypertension 10/02/2006   Past Medical History:  Diagnosis Date   Essential hypertension 10/02/2006   Qualifier: Diagnosis of  By: Kassie MD, Alyce LABOR    GOITER, MULTINODULAR 09/11/2007   Qualifier: Diagnosis of  By: Kassie MD, Sean A    HYPERGLYCEMIA 09/11/2007   Qualifier: Diagnosis of  By: Kassie MD, Sean A    Hyperthyroidism 10/02/2006   Qualifier: Diagnosis of  By: Kassie MD, Alyce A    History reviewed. No pertinent surgical history. Social History[1] Family History  Problem Relation Age of Onset   Breast cancer Maternal Grandmother    Allergies[2]  ROS Negative unless stated above    Objective:     BP 110/70 (BP Location: Left Arm, Patient Position: Sitting, Cuff Size: Large)   Pulse 100   Temp 98.1 F (36.7 C) (Oral)   Ht 5' (1.524 m)   Wt 188 lb 6.4 oz (85.5 kg)   LMP 06/22/2011 Comment: Postmenopausal  SpO2 97%   BMI 36.79 kg/m  BP Readings from Last 3 Encounters:  03/13/24 110/70  03/13/24 110/70  09/11/23 106/70   Wt  Readings from Last 3 Encounters:  03/13/24 188 lb 6.4 oz (85.5 kg)  03/13/24 188 lb 6.4 oz (85.5 kg)  09/11/23 204 lb (92.5 kg)      Physical Exam Constitutional:      Appearance: Normal appearance.  HENT:     Head: Normocephalic and atraumatic.     Right Ear: Tympanic membrane, ear canal and external ear normal.     Left Ear: Tympanic membrane, ear canal and external ear normal.     Nose: Nose normal.     Mouth/Throat:     Mouth: Mucous membranes are moist.     Pharynx: No oropharyngeal exudate or posterior oropharyngeal erythema.  Eyes:     General: No scleral icterus.    Extraocular Movements: Extraocular movements intact.     Conjunctiva/sclera: Conjunctivae normal.     Pupils: Pupils are equal, round, and reactive to light.  Neck:     Thyroid : No thyromegaly.     Vascular: No carotid bruit.  Cardiovascular:     Rate and Rhythm: Normal rate and regular rhythm.     Pulses: Normal pulses.     Heart sounds: Normal heart sounds. No murmur heard.    No friction rub.  Pulmonary:     Effort: Pulmonary effort is normal.     Breath sounds: Normal breath sounds. No wheezing, rhonchi or rales.  Abdominal:     General:  Bowel sounds are normal.     Palpations: Abdomen is soft.     Tenderness: There is no abdominal tenderness.  Musculoskeletal:        General: No deformity. Normal range of motion.  Lymphadenopathy:     Cervical: No cervical adenopathy.  Skin:    General: Skin is warm and dry.     Findings: No lesion.     Comments: Nevi on neck.  Neurological:     General: No focal deficit present.     Mental Status: She is alert and oriented to person, place, and time.  Psychiatric:        Mood and Affect: Mood normal.        Thought Content: Thought content normal.        03/13/2024    9:20 AM 09/11/2023   10:21 AM 03/20/2023   10:49 AM  Depression screen PHQ 2/9  Decreased Interest 0 0 0  Down, Depressed, Hopeless 0 0 0  PHQ - 2 Score 0 0 0  Altered sleeping 0 0 0   Tired, decreased energy 0 0 0  Change in appetite 0 0 0  Feeling bad or failure about yourself  0 0 0  Trouble concentrating 0 0 0  Moving slowly or fidgety/restless 0 0 0  Suicidal thoughts 0 0 0  PHQ-9 Score 0 0  0   Difficult doing work/chores Not difficult at all  Not difficult at all     Data saved with a previous flowsheet row definition      03/13/2024    9:20 AM 09/11/2023   10:21 AM 03/20/2023   10:49 AM 08/22/2022   10:38 AM  GAD 7 : Generalized Anxiety Score  Nervous, Anxious, on Edge 0 0  0  0   Control/stop worrying 0 0  0  0   Worry too much - different things 0 0  0  0   Trouble relaxing 0 0  0  0   Restless 0 0  0  0   Easily annoyed or irritable 0 0  0  0   Afraid - awful might happen 0 0  0  0   Total GAD 7 Score 0 0 0 0  Anxiety Difficulty Not difficult at all  Not difficult at all      Data saved with a previous flowsheet row definition     No results found for any visits on 03/13/24.    Assessment & Plan:   Well adult exam -     TSH -     T4, free -     Hemoglobin A1c -     Comprehensive metabolic panel with GFR -     CBC with Differential/Platelet -     Lipid panel  Essential hypertension -     TSH -     T4, free -     Comprehensive metabolic panel with GFR  Prediabetes -     Hemoglobin A1c  Mixed hyperlipidemia -     Lipid panel   Age-appropriate health screenings discussed.  Obtain labs.  Medications reviewed.  Consider influenza vaccine, pneumonia vaccine, shingles vaccines.  Mammogram done 02/12/2023.  Pap done 12/16/2021 with OB/GYN.  Colonoscopy done 07/21/2021.  BP well-controlled.  Continue Toprol  XL 25 mg half tab daily and amiloride -HCTZ 5-50 mg daily.  Continue monitoring BP at home.  Discussed d/c'ing Toprol  XL to see how bp does.  Can restart for consistent elevations.  Pt wishes to wait.  Body mass index is 36.79 kg/m.  Continue lifestyle modifications.  A1c 5.9% on 09/11/2023.  Total cholesterol 213, LDL cholesterol 139, HDL 57,  triglycerides 82 on 09/11/2023.  For continued elevation discussed starting rosuvastatin 5 mg daily.  Return in about 1 year (around 03/13/2025) for physical.  f/u in 6 months, sooner if needed for chronic conditions.   Danielle Day Single, MD    [1]  Social History Tobacco Use   Smoking status: Never   Smokeless tobacco: Never  [2]  Allergies Allergen Reactions   Aspirin Palpitations   Caffeine Palpitations   "
